# Patient Record
Sex: Female | Born: 1935 | Race: White | Hispanic: No | Smoking: Former smoker
Health system: Southern US, Community
[De-identification: ages and names within clinical notes are randomized; demographics above are authoritative.]

## PROBLEM LIST (undated history)

## (undated) DIAGNOSIS — R42 Dizziness and giddiness: Secondary | ICD-10-CM

## (undated) DIAGNOSIS — L719 Rosacea, unspecified: Secondary | ICD-10-CM

## (undated) DIAGNOSIS — R54 Age-related physical debility: Secondary | ICD-10-CM

## (undated) DIAGNOSIS — K219 Gastro-esophageal reflux disease without esophagitis: Secondary | ICD-10-CM

## (undated) DIAGNOSIS — R0600 Dyspnea, unspecified: Secondary | ICD-10-CM

## (undated) DIAGNOSIS — R413 Other amnesia: Secondary | ICD-10-CM

## (undated) DIAGNOSIS — L409 Psoriasis, unspecified: Secondary | ICD-10-CM

## (undated) DIAGNOSIS — M1990 Unspecified osteoarthritis, unspecified site: Secondary | ICD-10-CM

## (undated) DIAGNOSIS — E039 Hypothyroidism, unspecified: Secondary | ICD-10-CM

## (undated) DIAGNOSIS — I10 Essential (primary) hypertension: Secondary | ICD-10-CM

## (undated) DIAGNOSIS — H9190 Unspecified hearing loss, unspecified ear: Secondary | ICD-10-CM

## (undated) DIAGNOSIS — IMO0001 Reserved for inherently not codable concepts without codable children: Secondary | ICD-10-CM

## (undated) DIAGNOSIS — Z515 Encounter for palliative care: Secondary | ICD-10-CM

## (undated) DIAGNOSIS — E119 Type 2 diabetes mellitus without complications: Secondary | ICD-10-CM

## (undated) DIAGNOSIS — J449 Chronic obstructive pulmonary disease, unspecified: Secondary | ICD-10-CM

## (undated) HISTORY — DX: Other amnesia: R41.3

## (undated) HISTORY — PX: BACK SURGERY: SHX140

## (undated) HISTORY — DX: Dyspnea, unspecified: R06.00

## (undated) HISTORY — DX: Encounter for palliative care: Z51.5

## (undated) HISTORY — DX: Age-related physical debility: R54

## (undated) HISTORY — PX: ABDOMINAL HYSTERECTOMY: SHX81

---

## 2004-05-23 ENCOUNTER — Ambulatory Visit: Payer: Self-pay | Admitting: Gastroenterology

## 2004-07-31 ENCOUNTER — Ambulatory Visit: Payer: Self-pay | Admitting: Family Medicine

## 2005-08-25 ENCOUNTER — Ambulatory Visit: Payer: Self-pay | Admitting: Gastroenterology

## 2005-09-25 ENCOUNTER — Ambulatory Visit: Payer: Self-pay | Admitting: Family Medicine

## 2005-12-27 ENCOUNTER — Other Ambulatory Visit: Payer: Self-pay

## 2005-12-27 ENCOUNTER — Emergency Department: Payer: Self-pay | Admitting: Emergency Medicine

## 2006-10-28 ENCOUNTER — Ambulatory Visit: Payer: Self-pay | Admitting: Family Medicine

## 2008-01-19 ENCOUNTER — Ambulatory Visit: Payer: Self-pay | Admitting: Family Medicine

## 2008-05-26 ENCOUNTER — Ambulatory Visit: Payer: Self-pay | Admitting: Family Medicine

## 2008-07-03 ENCOUNTER — Ambulatory Visit: Payer: Self-pay | Admitting: Unknown Physician Specialty

## 2009-07-18 ENCOUNTER — Ambulatory Visit: Payer: Self-pay | Admitting: Specialist

## 2009-08-30 ENCOUNTER — Ambulatory Visit: Payer: Self-pay | Admitting: Specialist

## 2009-09-12 ENCOUNTER — Ambulatory Visit: Payer: Self-pay

## 2010-09-18 ENCOUNTER — Ambulatory Visit: Payer: Self-pay | Admitting: Internal Medicine

## 2011-09-30 ENCOUNTER — Ambulatory Visit: Payer: Self-pay | Admitting: Internal Medicine

## 2014-07-11 ENCOUNTER — Encounter
Admission: RE | Admit: 2014-07-11 | Discharge: 2014-07-11 | Disposition: A | Discharge status: Home / Self Care | Payer: Medicare Other | Source: Ambulatory Visit | Attending: Ophthalmology | Admitting: Ophthalmology

## 2014-07-11 DIAGNOSIS — Z01812 Encounter for preprocedural laboratory examination: Secondary | ICD-10-CM | POA: Insufficient documentation

## 2014-07-11 DIAGNOSIS — Z01810 Encounter for preprocedural cardiovascular examination: Secondary | ICD-10-CM | POA: Diagnosis present

## 2014-07-11 LAB — POTASSIUM: Potassium: 4.5 mmol/L (ref 3.5–5.1)

## 2014-07-18 ENCOUNTER — Ambulatory Visit: Payer: Medicare Other | Admitting: Anesthesiology

## 2014-07-18 ENCOUNTER — Encounter: Payer: Self-pay | Admitting: *Deleted

## 2014-07-18 ENCOUNTER — Ambulatory Visit
Admission: RE | Admit: 2014-07-18 | Discharge: 2014-07-18 | Disposition: A | Discharge status: Home / Self Care | Payer: Medicare Other | Source: Ambulatory Visit | Attending: Ophthalmology | Admitting: Ophthalmology

## 2014-07-18 ENCOUNTER — Encounter
Admission: RE | Disposition: A | Discharge status: Home / Self Care | Payer: Self-pay | Source: Ambulatory Visit | Attending: Ophthalmology

## 2014-07-18 DIAGNOSIS — E780 Pure hypercholesterolemia: Secondary | ICD-10-CM | POA: Diagnosis not present

## 2014-07-18 DIAGNOSIS — Z79899 Other long term (current) drug therapy: Secondary | ICD-10-CM | POA: Insufficient documentation

## 2014-07-18 DIAGNOSIS — K219 Gastro-esophageal reflux disease without esophagitis: Secondary | ICD-10-CM | POA: Diagnosis not present

## 2014-07-18 DIAGNOSIS — H2511 Age-related nuclear cataract, right eye: Secondary | ICD-10-CM | POA: Diagnosis not present

## 2014-07-18 DIAGNOSIS — Z87891 Personal history of nicotine dependence: Secondary | ICD-10-CM | POA: Diagnosis not present

## 2014-07-18 DIAGNOSIS — M069 Rheumatoid arthritis, unspecified: Secondary | ICD-10-CM | POA: Insufficient documentation

## 2014-07-18 DIAGNOSIS — J449 Chronic obstructive pulmonary disease, unspecified: Secondary | ICD-10-CM | POA: Insufficient documentation

## 2014-07-18 DIAGNOSIS — L409 Psoriasis, unspecified: Secondary | ICD-10-CM | POA: Insufficient documentation

## 2014-07-18 DIAGNOSIS — E119 Type 2 diabetes mellitus without complications: Secondary | ICD-10-CM | POA: Insufficient documentation

## 2014-07-18 DIAGNOSIS — I10 Essential (primary) hypertension: Secondary | ICD-10-CM | POA: Diagnosis not present

## 2014-07-18 DIAGNOSIS — H9190 Unspecified hearing loss, unspecified ear: Secondary | ICD-10-CM | POA: Insufficient documentation

## 2014-07-18 HISTORY — DX: Hypothyroidism, unspecified: E03.9

## 2014-07-18 HISTORY — DX: Essential (primary) hypertension: I10

## 2014-07-18 HISTORY — PX: CATARACT EXTRACTION W/PHACO: SHX586

## 2014-07-18 HISTORY — DX: Type 2 diabetes mellitus without complications: E11.9

## 2014-07-18 LAB — GLUCOSE, CAPILLARY: Glucose-Capillary: 99 mg/dL (ref 65–99)

## 2014-07-18 SURGERY — PHACOEMULSIFICATION, CATARACT, WITH IOL INSERTION
Anesthesia: Monitor Anesthesia Care | Site: Eye | Laterality: Right | Wound class: Clean

## 2014-07-18 MED ORDER — CEFUROXIME OPHTHALMIC INJECTION 1 MG/0.1 ML
INJECTION | OPHTHALMIC | Status: DC | PRN
  Administered 2014-07-18: 0.1 mL via INTRACAMERAL

## 2014-07-18 MED ORDER — SODIUM CHLORIDE 0.9 % IV SOLN
INTRAVENOUS | Status: DC
  Administered 2014-07-18 (×2): via INTRAVENOUS

## 2014-07-18 MED ORDER — MIDAZOLAM HCL 2 MG/2ML IJ SOLN
INTRAMUSCULAR | Status: DC | PRN
  Administered 2014-07-18: 1 mg via INTRAVENOUS

## 2014-07-18 MED ORDER — ARMC OPHTHALMIC DILATING GEL
1.0000 "application " | OPHTHALMIC | Status: DC | PRN
  Administered 2014-07-18 (×2): 1 via OPHTHALMIC

## 2014-07-18 MED ORDER — EPINEPHRINE HCL 1 MG/ML IJ SOLN
INTRAOCULAR | Status: DC | PRN
  Administered 2014-07-18: 200 mL

## 2014-07-18 MED ORDER — CARBACHOL 0.01 % IO SOLN
INTRAOCULAR | Status: DC | PRN
  Administered 2014-07-18: 0.5 mL via INTRAOCULAR

## 2014-07-18 MED ORDER — LIDOCAINE HCL (PF) 1 % IJ SOLN
INTRAOCULAR | Status: DC | PRN
  Administered 2014-07-18: .5 mL via OPHTHALMIC

## 2014-07-18 MED ORDER — MOXIFLOXACIN HCL 0.5 % OP SOLN: 1.0000 [drp] | OPHTHALMIC | Status: DC | PRN

## 2014-07-18 MED ORDER — NA CHONDROIT SULF-NA HYALURON 40-17 MG/ML IO SOLN
INTRAOCULAR | Status: AC
  Filled 2014-07-18: qty 1

## 2014-07-18 MED ORDER — EPINEPHRINE HCL 1 MG/ML IJ SOLN
INTRAMUSCULAR | Status: AC
  Filled 2014-07-18: qty 2

## 2014-07-18 MED ORDER — POVIDONE-IODINE 5 % OP SOLN
OPHTHALMIC | Status: DC
Start: 2014-07-18 — End: 2014-07-18
  Filled 2014-07-18: qty 30

## 2014-07-18 MED ORDER — ARMC OPHTHALMIC DILATING GEL
OPHTHALMIC | Status: AC
  Filled 2014-07-18: qty 0.25

## 2014-07-18 MED ORDER — MOXIFLOXACIN HCL 0.5 % OP SOLN
OPHTHALMIC | Status: AC
  Filled 2014-07-18: qty 3

## 2014-07-18 MED ORDER — FENTANYL CITRATE (PF) 100 MCG/2ML IJ SOLN
INTRAMUSCULAR | Status: DC | PRN
  Administered 2014-07-18: 50 ug via INTRAVENOUS

## 2014-07-18 MED ORDER — CEFUROXIME OPHTHALMIC INJECTION 1 MG/0.1 ML
INJECTION | OPHTHALMIC | Status: AC
  Filled 2014-07-18: qty 0.1

## 2014-07-18 MED ORDER — TETRACAINE HCL 0.5 % OP SOLN
OPHTHALMIC | Status: AC
  Filled 2014-07-18: qty 2

## 2014-07-18 MED ORDER — LIDOCAINE HCL (PF) 4 % IJ SOLN
INTRAMUSCULAR | Status: AC
  Filled 2014-07-18: qty 5

## 2014-07-18 MED ORDER — TETRACAINE HCL 0.5 % OP SOLN
1.0000 [drp] | OPHTHALMIC | Status: DC | PRN
  Administered 2014-07-18: 1 [drp] via OPHTHALMIC

## 2014-07-18 MED ORDER — MOXIFLOXACIN HCL 0.5 % OP SOLN
OPHTHALMIC | Status: DC | PRN
  Administered 2014-07-18: 1 [drp] via OPHTHALMIC

## 2014-07-18 MED ORDER — POVIDONE-IODINE 5 % OP SOLN
1.0000 "application " | OPHTHALMIC | Status: DC | PRN
  Administered 2014-07-18: 1 via OPHTHALMIC

## 2014-07-18 SURGICAL SUPPLY — 22 items
CANNULA ANT/CHMB 27GA (MISCELLANEOUS) ×6 IMPLANT
CUP MEDICINE 2OZ PLAST GRAD ST (MISCELLANEOUS) ×3 IMPLANT
GLOVE BIO SURGEON STRL SZ8 (GLOVE) ×3 IMPLANT
GLOVE BIOGEL M 6.5 STRL (GLOVE) ×3 IMPLANT
GLOVE SURG LX 8.0 MICRO (GLOVE) ×2
GLOVE SURG LX STRL 8.0 MICRO (GLOVE) ×1 IMPLANT
GOWN STRL REUS W/ TWL LRG LVL3 (GOWN DISPOSABLE) ×2 IMPLANT
GOWN STRL REUS W/TWL LRG LVL3 (GOWN DISPOSABLE) ×4
LENS IOL TECNIS 20.5 (Intraocular Lens) ×3 IMPLANT
LENS IOL TECNIS MONO 1P 20.5 (Intraocular Lens) ×1 IMPLANT
PACK CATARACT (MISCELLANEOUS) ×3 IMPLANT
PACK CATARACT BRASINGTON LX (MISCELLANEOUS) ×3 IMPLANT
PACK EYE AFTER SURG (MISCELLANEOUS) ×3 IMPLANT
SOL BSS BAG (MISCELLANEOUS) ×3
SOL PREP PVP 2OZ (MISCELLANEOUS) ×3
SOLUTION BSS BAG (MISCELLANEOUS) ×1 IMPLANT
SOLUTION PREP PVP 2OZ (MISCELLANEOUS) ×1 IMPLANT
SYR 3ML LL SCALE MARK (SYRINGE) ×3 IMPLANT
SYR 5ML LL (SYRINGE) ×3 IMPLANT
SYR TB 1ML 27GX1/2 LL (SYRINGE) ×3 IMPLANT
WATER STERILE IRR 1000ML POUR (IV SOLUTION) ×3 IMPLANT
WIPE NON LINTING 3.25X3.25 (MISCELLANEOUS) ×3 IMPLANT

## 2014-07-18 NOTE — Op Note (Signed)
PREOPERATIVE DIAGNOSIS:  Nuclear sclerotic cataract of the right eye.   POSTOPERATIVE DIAGNOSIS: nuclear sclerotic cataract right eye   OPERATIVE PROCEDURE:  Procedure(s): CATARACT EXTRACTION PHACO AND INTRAOCULAR LENS PLACEMENT (IOC)   SURGEON:  Galen Manila, MD.   ANESTHESIA:  Anesthesiologist: Berdine Addison, MD CRNA: Junious Silk, CRNA  1.      Managed anesthesia care. 2.      Topical tetracaine drops followed by 2% Xylocaine jelly applied in the preoperative holding area.       3.   0.2 ml of epi-Shugarcaine was  placed in the anterior chamber following the paracentesis.    COMPLICATIONS:  None.   TECHNIQUE:   Stop and chop   DESCRIPTION OF PROCEDURE:  The patient was examined and consented in the preoperative holding area where the aforementioned topical anesthesia was applied to the right eye and then brought back to the Operating Room where the right eye was prepped and draped in the usual sterile ophthalmic fashion and a lid speculum was placed. A paracentesis was created with the side port blade and the anterior chamber was filled with viscoelastic. A near clear corneal incision was performed with the steel keratome. A continuous curvilinear capsulorrhexis was performed with a cystotome followed by the capsulorrhexis forceps. Hydrodissection and hydrodelineation were carried out with BSS on a blunt cannula. The lens was removed in a stop and chop  technique and the remaining cortical material was removed with the irrigation-aspiration handpiece. The capsular bag was inflated with viscoelastic and the Technis ZCB00  lens was placed in the capsular bag without complication. The remaining viscoelastic was removed from the eye with the irrigation-aspiration handpiece. The wounds were hydrated. The anterior chamber was flushed with Miostat and the eye was inflated to physiologic pressure. 0.1 mL of cefuroxime concentration 10 mg/mL was placed in the anterior chamber. The wounds were  found to be water tight. The eye was dressed with Vigamox. The patient was given protective glasses to wear throughout the day and a shield with which to sleep tonight. The patient was also given drops with which to begin a drop regimen today and will follow-up with me in one day.  Implant Name Type Inv. Item Serial No. Manufacturer Lot No. LRB No. Used  LENS IMPL INTRAOC ZCB00 20.5 - [id number] Intraocular Lens LENS IMPL INTRAOC ZCB00 20.5 [id number]   Right 1   Procedure(s) with comments: CATARACT EXTRACTION PHACO AND INTRAOCULAR LENS PLACEMENT (IOC) (Right) - [place] 01:18 AP% 26.9 CDE 21.20 fluid pack lot #[id number]  Electronically signed: Ellarose Brandi L[place]IS 07/18/2014 8:15 AM

## 2014-07-18 NOTE — Anesthesia Preprocedure Evaluation (Signed)
Anesthesia Evaluation  Patient identified by MRN, date of birth, ID band Patient awake    Reviewed: Allergy & Precautions, NPO status , Patient's Chart, lab work & pertinent test results, reviewed documented beta blocker date and time   Airway Mallampati: II  TM Distance: >3 FB     Dental  (+) Chipped   Pulmonary former smoker,          Cardiovascular hypertension, Pt. on medications     Neuro/Psych    GI/Hepatic   Endo/Other  diabetesHypothyroidism   Renal/GU      Musculoskeletal   Abdominal   Peds  Hematology   Anesthesia Other Findings   Reproductive/Obstetrics                             Anesthesia Physical Anesthesia Plan  ASA: III  Anesthesia Plan: General and MAC   Post-op Pain Management:    Induction:   Airway Management Planned:   Additional Equipment:   Intra-op Plan:   Post-operative Plan:   Informed Consent: I have reviewed the patients History and Physical, chart, labs and discussed the procedure including the risks, benefits and alternatives for the proposed anesthesia with the patient or authorized representative who has indicated his/her understanding and acceptance.     Plan Discussed with: CRNA  Anesthesia Plan Comments:         Anesthesia Quick Evaluation

## 2014-07-18 NOTE — Discharge Instructions (Signed)
AMBULATORY SURGERY  °DISCHARGE INSTRUCTIONS ° ° °1) The drugs that you were given will stay in your system until tomorrow so for the next 24 hours you should not: ° °A) Drive an automobile °B) Make any legal decisions °C) Drink any alcoholic beverage ° ° °2) You may resume regular meals tomorrow.  Today it is better to start with liquids and gradually work up to solid foods. ° °You may eat anything you prefer, but it is better to start with liquids, then soup and crackers, and gradually work up to solid foods. ° ° °3) Please notify your doctor immediately if you have any unusual bleeding, trouble breathing, redness and pain at the surgery site, drainage, fever, or pain not relieved by medication. ° ° ° °4) Additional Instructions: ° ° ° °Cataract Surgery °Care After °Refer to this sheet in the next few weeks. These instructions provide you with information on caring for yourself after your procedure. Your caregiver may also give you more specific instructions. Your treatment has been planned according to current medical practices, but problems sometimes occur. Call your caregiver if you have any problems or questions after your procedure.  °HOME CARE INSTRUCTIONS  °· Avoid strenuous activities as directed by your caregiver. °· Ask your caregiver when you can resume driving. °· Use eyedrops or other medicines to help healing and control pressure inside your eye as directed by your caregiver. °· Only take over-the-counter or prescription medicines for pain, discomfort, or fever as directed by your caregiver. °· Do not to touch or rub your eyes. °· You may be instructed to use a protective shield during the first few days and nights after surgery. If not, wear sunglasses to protect your eyes. This is to protect the eye from pressure or from being accidentally bumped. °· Keep the area around your eye clean and dry. Avoid swimming or allowing water to hit you directly in the face while showering. Keep soap and shampoo  out of your eyes. °· Do not bend or lift heavy objects. Bending increases pressure in the eye. You can walk, climb stairs, and do light household chores. °· Do not put a contact lens into the eye that had surgery until your caregiver says it is okay to do so. °· Ask your doctor when you can return to work. This will depend on the kind of work that you do. If you work in a dusty environment, you may be advised to wear protective eyewear for a period of time. °· Ask your caregiver when it will be safe to engage in sexual activity. °· Continue with your regular eye exams as directed by your caregiver. °What to expect: °· It is normal to feel itching and mild discomfort for a few days after cataract surgery. Some fluid discharge is also common, and your eye may be sensitive to light and touch. °· After 1 to 2 days, even moderate discomfort should disappear. In most cases, healing will take about 6 weeks. °· If you received an intraocular lens (IOL), you may notice that colors are very bright or have a blue tinge. Also, if you have been in bright sunlight, everything may appear reddish for a few hours. If you see these color tinges, it is because your lens is clear and no longer cloudy. Within a few months after receiving an IOL, these extra colors should go away. When you have healed, you will probably need new glasses. °SEEK MEDICAL CARE IF:  °· You have increased bruising around your   eye. °· You have discomfort not helped by medicine. °SEEK IMMEDIATE MEDICAL CARE IF:  °· You have a  fever. °· You have a worsening or sudden vision loss. °· You have redness, swelling, or increasing pain in the eye. °· You have a thick discharge from the eye that had surgery. °MAKE SURE YOU: °· Understand these instructions. °· Will watch your condition. °· Will get help right away if you are not doing well or get worse. °Document Released: 07/12/2004 Document Revised: 03/17/2011 Document Reviewed: 08/16/2010 °ExitCare® Patient  Information ©2015 [employer]. This information is not intended to replace advice given to you by your health care provider. Make sure you discuss any questions you have with your health care provider. ° ° ° ° °Please contact your physician with any problems or Same Day Surgery at [phone], Monday through Friday 6 am to 4 pm, or [place] [facility] [place] Main number at [phone]. °

## 2014-07-18 NOTE — H&P (Signed)
  All labs reviewed. Abnormal studies sent to patients PCP when indicated.  Previous H&P reviewed, patient examined, there are NO CHANGES.  Amanda Mercado LOUIS7/12/20167:44 AM

## 2014-07-18 NOTE — Anesthesia Procedure Notes (Signed)
Procedure Name: MAC Date/Time: 07/18/2014 8:00 AM Performed by: Junious Silk Pre-anesthesia Checklist: Patient identified, Emergency Drugs available, Suction available, Patient being monitored and Timeout performed

## 2014-07-18 NOTE — Anesthesia Postprocedure Evaluation (Signed)
  Anesthesia Post-op Note  Patient: Amanda Mercado  Procedure(s) Performed: Procedure(s) with comments: CATARACT EXTRACTION PHACO AND INTRAOCULAR LENS PLACEMENT (IOC) (Right) - [place] 01:18 AP% 26.9 CDE 21.20 fluid pack lot #[id number]  Anesthesia type:General, MAC  Patient location: PACU  Post pain: Pain level controlled  Post assessment: Post-op Vital signs reviewed, Patient's Cardiovascular Status Stable, Respiratory Function Stable, Patent Airway and No signs of Nausea or vomiting  Post vital signs: Reviewed and stable  Last Vitals:  Filed Vitals:   07/18/14 0702  BP: 100/88  Pulse: 76  Temp: 36.9 C  Resp: 18    Level of consciousness: awake, alert  and patient cooperative  Complications: No apparent anesthesia complications

## 2014-07-18 NOTE — Transfer of Care (Signed)
Immediate Anesthesia Transfer of Care Note  Patient: Amanda Mercado  Procedure(s) Performed: Procedure(s) with comments: CATARACT EXTRACTION PHACO AND INTRAOCULAR LENS PLACEMENT (IOC) (Right) - [place] 01:18 AP% 26.9 CDE 21.20 fluid pack lot #[id number]  Patient Location: PACU  Anesthesia Type:MAC  Level of Consciousness: awake, alert  and oriented  Airway & Oxygen Therapy: Patient Spontanous Breathing  Post-op Assessment: Report given to RN  Post vital signs: Reviewed and stable  Last Vitals:  Filed Vitals:   07/18/14 0702  BP: 100/88  Pulse: 76  Temp: 36.9 C  Resp: 18    Complications: No apparent anesthesia complications

## 2014-07-25 ENCOUNTER — Other Ambulatory Visit: Payer: Medicare Other

## 2014-07-27 ENCOUNTER — Encounter: Payer: Self-pay | Admitting: *Deleted

## 2014-07-27 NOTE — OR Nursing (Signed)
Seated in chair for first eye and would like if available

## 2014-07-28 ENCOUNTER — Inpatient Hospital Stay: Admission: RE | Admit: 2014-07-28 | Payer: Medicare Other | Source: Ambulatory Visit

## 2014-08-01 ENCOUNTER — Encounter: Payer: Self-pay | Admitting: *Deleted

## 2014-08-01 ENCOUNTER — Encounter
Admission: RE | Disposition: A | Discharge status: Home / Self Care | Payer: Self-pay | Source: Ambulatory Visit | Attending: Ophthalmology

## 2014-08-01 ENCOUNTER — Ambulatory Visit: Payer: Medicare Other | Admitting: Certified Registered Nurse Anesthetist

## 2014-08-01 ENCOUNTER — Ambulatory Visit
Admission: RE | Admit: 2014-08-01 | Discharge: 2014-08-01 | Disposition: A | Discharge status: Home / Self Care | Payer: Medicare Other | Source: Ambulatory Visit | Attending: Ophthalmology | Admitting: Ophthalmology

## 2014-08-01 DIAGNOSIS — Z87891 Personal history of nicotine dependence: Secondary | ICD-10-CM | POA: Diagnosis not present

## 2014-08-01 DIAGNOSIS — J449 Chronic obstructive pulmonary disease, unspecified: Secondary | ICD-10-CM | POA: Diagnosis not present

## 2014-08-01 DIAGNOSIS — H2512 Age-related nuclear cataract, left eye: Secondary | ICD-10-CM | POA: Diagnosis not present

## 2014-08-01 DIAGNOSIS — E119 Type 2 diabetes mellitus without complications: Secondary | ICD-10-CM | POA: Insufficient documentation

## 2014-08-01 DIAGNOSIS — Z79899 Other long term (current) drug therapy: Secondary | ICD-10-CM | POA: Insufficient documentation

## 2014-08-01 HISTORY — DX: Psoriasis, unspecified: L40.9

## 2014-08-01 HISTORY — DX: Chronic obstructive pulmonary disease, unspecified: J44.9

## 2014-08-01 HISTORY — DX: Rosacea, unspecified: L71.9

## 2014-08-01 HISTORY — PX: ANTERIOR VITRECTOMY: SHX1173

## 2014-08-01 HISTORY — DX: Dizziness and giddiness: R42

## 2014-08-01 HISTORY — DX: Gastro-esophageal reflux disease without esophagitis: K21.9

## 2014-08-01 HISTORY — DX: Reserved for inherently not codable concepts without codable children: IMO0001

## 2014-08-01 HISTORY — PX: CATARACT EXTRACTION W/PHACO: SHX586

## 2014-08-01 HISTORY — DX: Unspecified osteoarthritis, unspecified site: M19.90

## 2014-08-01 HISTORY — DX: Unspecified hearing loss, unspecified ear: H91.90

## 2014-08-01 LAB — GLUCOSE, CAPILLARY: Glucose-Capillary: 116 mg/dL — ABNORMAL HIGH (ref 65–99)

## 2014-08-01 SURGERY — PHACOEMULSIFICATION, CATARACT, WITH IOL INSERTION
Anesthesia: Monitor Anesthesia Care | Laterality: Left

## 2014-08-01 MED ORDER — EPINEPHRINE HCL 1 MG/ML IJ SOLN
INTRAMUSCULAR | Status: AC
  Filled 2014-08-01: qty 1

## 2014-08-01 MED ORDER — LIDOCAINE HCL (PF) 4 % IJ SOLN
INTRAMUSCULAR | Status: AC
  Filled 2014-08-01: qty 5

## 2014-08-01 MED ORDER — BRIMONIDINE TARTRATE 0.2 % OP SOLN
OPHTHALMIC | Status: AC
  Filled 2014-08-01: qty 5

## 2014-08-01 MED ORDER — POVIDONE-IODINE 5 % OP SOLN
OPHTHALMIC | Status: AC
  Administered 2014-08-01: 1 via OPHTHALMIC
  Filled 2014-08-01: qty 30

## 2014-08-01 MED ORDER — EPINEPHRINE HCL 1 MG/ML IJ SOLN
INTRAOCULAR | Status: DC | PRN
  Administered 2014-08-01: 250 mL

## 2014-08-01 MED ORDER — DEXMEDETOMIDINE HCL 200 MCG/2ML IV SOLN
INTRAVENOUS | Status: DC | PRN
Start: 2014-08-01 — End: 2014-08-01
  Administered 2014-08-01: 8 ug via INTRAVENOUS

## 2014-08-01 MED ORDER — SODIUM CHLORIDE 0.9 % IV SOLN
INTRAVENOUS | Status: DC
  Administered 2014-08-01: 07:00:00 via INTRAVENOUS

## 2014-08-01 MED ORDER — MOXIFLOXACIN HCL 0.5 % OP SOLN
OPHTHALMIC | Status: DC | PRN
  Administered 2014-08-01: 2 [drp] via OPHTHALMIC

## 2014-08-01 MED ORDER — ARMC OPHTHALMIC DILATING GEL
1.0000 "application " | OPHTHALMIC | Status: DC | PRN
  Administered 2014-08-01: 1 via OPHTHALMIC

## 2014-08-01 MED ORDER — MOXIFLOXACIN HCL 0.5 % OP SOLN
OPHTHALMIC | Status: AC
  Filled 2014-08-01: qty 3

## 2014-08-01 MED ORDER — FENTANYL CITRATE (PF) 100 MCG/2ML IJ SOLN
INTRAMUSCULAR | Status: DC | PRN
  Administered 2014-08-01: 50 ug via INTRAVENOUS

## 2014-08-01 MED ORDER — MOXIFLOXACIN HCL 0.5 % OP SOLN: 1.0000 [drp] | Freq: Once | OPHTHALMIC | Status: DC

## 2014-08-01 MED ORDER — BRIMONIDINE TARTRATE 0.2 % OP SOLN
OPHTHALMIC | Status: DC | PRN
  Administered 2014-08-01: 2 [drp] via OPHTHALMIC

## 2014-08-01 MED ORDER — CEFUROXIME OPHTHALMIC INJECTION 1 MG/0.1 ML
INJECTION | OPHTHALMIC | Status: DC | PRN
Start: 2014-08-01 — End: 2014-08-01
  Administered 2014-08-01: 0.1 mL via INTRACAMERAL

## 2014-08-01 MED ORDER — CEFUROXIME OPHTHALMIC INJECTION 1 MG/0.1 ML
INJECTION | OPHTHALMIC | Status: AC
  Filled 2014-08-01: qty 0.1

## 2014-08-01 MED ORDER — NA CHONDROIT SULF-NA HYALURON 40-17 MG/ML IO SOLN
INTRAOCULAR | Status: AC
  Filled 2014-08-01: qty 1

## 2014-08-01 MED ORDER — CARBACHOL 0.01 % IO SOLN
INTRAOCULAR | Status: DC | PRN
  Administered 2014-08-01: 0.5 mL via INTRAOCULAR

## 2014-08-01 MED ORDER — TETRACAINE HCL 0.5 % OP SOLN
OPHTHALMIC | Status: AC
  Administered 2014-08-01: 1 [drp] via OPHTHALMIC
  Filled 2014-08-01: qty 2

## 2014-08-01 MED ORDER — LIDOCAINE HCL (PF) 4 % IJ SOLN
INTRAMUSCULAR | Status: DC | PRN
  Administered 2014-08-01: 1 mL

## 2014-08-01 MED ORDER — POVIDONE-IODINE 5 % OP SOLN
1.0000 "application " | OPHTHALMIC | Status: AC | PRN
  Administered 2014-08-01: 1 via OPHTHALMIC

## 2014-08-01 MED ORDER — TETRACAINE HCL 0.5 % OP SOLN
1.0000 [drp] | OPHTHALMIC | Status: AC | PRN
  Administered 2014-08-01: 1 [drp] via OPHTHALMIC

## 2014-08-01 MED ORDER — ARMC OPHTHALMIC DILATING GEL
OPHTHALMIC | Status: AC
  Administered 2014-08-01: 1 via OPHTHALMIC
  Filled 2014-08-01: qty 0.25

## 2014-08-01 SURGICAL SUPPLY — 28 items
ANTERIOR CHAMBER CANNULA ×2 IMPLANT
BSS 15ML ×3 IMPLANT
CANNULA ANT/CHMB 27GA (MISCELLANEOUS) ×3 IMPLANT
CUP MEDICINE 2OZ PLAST GRAD ST (MISCELLANEOUS) ×3 IMPLANT
GLOVE BIO SURGEON STRL SZ8 (GLOVE) ×3 IMPLANT
GLOVE BIOGEL M 6.5 STRL (GLOVE) ×3 IMPLANT
GLOVE SURG LX 8.0 MICRO (GLOVE) ×2
GLOVE SURG LX STRL 8.0 MICRO (GLOVE) ×1 IMPLANT
GOWN STRL REUS W/ TWL LRG LVL3 (GOWN DISPOSABLE) ×2 IMPLANT
GOWN STRL REUS W/TWL LRG LVL3 (GOWN DISPOSABLE) ×4
LENS IOL ACRSF EXPAND 0.0 (Intraocular Lens) ×1 IMPLANT
LENS IOL ACRYSOF EXPAND 0.0 (Intraocular Lens) ×3 IMPLANT
LENS IOL TECNIS 21 (Intraocular Lens) IMPLANT
LENS IOL TECNIS 21.0 (Intraocular Lens) IMPLANT
LENS IOL TECNIS MONO 1P 21.0 (Intraocular Lens) IMPLANT
PACK CATARACT (MISCELLANEOUS) ×3 IMPLANT
PACK CATARACT BRASINGTON LX (MISCELLANEOUS) ×3 IMPLANT
PACK EYE AFTER SURG (MISCELLANEOUS) ×3 IMPLANT
PLUG VITRECTOMY PROBE 23G (MISCELLANEOUS) ×3 IMPLANT
SOL BSS BAG (MISCELLANEOUS) ×3
SOL PREP PVP 2OZ (MISCELLANEOUS) ×3
SOLUTION BSS BAG (MISCELLANEOUS) ×1 IMPLANT
SOLUTION PREP PVP 2OZ (MISCELLANEOUS) ×1 IMPLANT
SUT ETHILON 10 0 CS140 6 (SUTURE) ×3 IMPLANT
SYR 5ML LL (SYRINGE) ×3 IMPLANT
SYR TB 1ML 27GX1/2 LL (SYRINGE) ×3 IMPLANT
WATER STERILE IRR 1000ML POUR (IV SOLUTION) ×3 IMPLANT
WIPE NON LINTING 3.25X3.25 (MISCELLANEOUS) ×3 IMPLANT

## 2014-08-01 NOTE — Op Note (Addendum)
PREOPERATIVE DIAGNOSIS:  Nuclear sclerotic cataract of the left eye.   POSTOPERATIVE DIAGNOSIS:  NUCLEAR SCLEROTIC CATARACT LEFT EYE    OPERATIVE PROCEDURE:  Procedure(s): CATARACT EXTRACTION PHACO AND INTRAOCULAR LENS PLACEMENT (IOC) ANTERIOR VITRECTOMY   SURGEON:  Galen Manila, MD.   ANESTHESIA:   Anesthesiologist: Yevette Edwards, MD CRNA: Darrol Jump, CRNA  1.      Managed anesthesia care. 2.      Topical tetracaine drops followed by 2% Xylocaine jelly applied in the preoperative holding area.       3.      0.2 ml of epi-Shugarcaine was  placed in the anterior chamber following the paracentesis.    COMPLICATIONS:  see  narrative below   TECHNIQUE:   Stop and chop   DESCRIPTION OF PROCEDURE:  The patient was examined and consented in the preoperative holding area where the aforementioned topical anesthesia was applied to the left eye and then brought back to the Operating Room where the left eye was prepped and draped in the usual sterile ophthalmic fashion and a lid speculum was placed. A paracentesis was created with the side port blade and the anterior chamber was filled with viscoelastic. A near clear corneal incision was performed with the steel keratome. A continuous curvilinear capsulorrhexis was performed with a cystotome followed by the capsulorrhexis forceps. Hydrodissection and hydrodelineation were carried out with BSS on a blunt cannula. The lens was removed in a stop and chop  technique.  As the last piece of the nucleus was removed a large rent in the posterior capsule developed.  Discovisc was used to tamponade the vitreous and an Alcon MA60 AC 21.0 diopter lens was placed in the sulcus. A 10-0 nylon suture was placed in the main incision and a second para was created to facilitate anterior vitrectomy. Vitreous was removed from the [facility]. Miostat was place in the eye followed by cefuroxime. The eye was inflated to a physiologic pressure. The eye was dressed with vigamox and  Alphagan. The course of surgery was discussed with the patient and her family.. The patient was given protective glasses to wear throughout the day and a shield with which to sleep tonight. The patient was also given drops with which to begin a drop regimen today and will follow-up with me later today.  Implant Name Type Inv. Item Serial No. Manufacturer Lot No. LRB No. Used  LENS IMPL INTRAOC ZCB00 21.0 - [id number] Intraocular Lens LENS IMPL INTRAOC ZCB00 21.0 [id number]  Left 1  LENS IOL - [id number] Intraocular Lens LENS IOL [id number] ALCON   Left 1   Procedure(s) with comments: CATARACT EXTRACTION PHACO AND INTRAOCULAR LENS PLACEMENT (IOC) (Left) - cassette lot # [id number] H   [place] 1:35.6AP  25.3 CDE 24.7 ANTERIOR VITRECTOMY (Left)  Electronically signed: Kiyani Jernigan L[place]IS 08/01/2014 8:11 AM

## 2014-08-01 NOTE — Anesthesia Preprocedure Evaluation (Addendum)
Anesthesia Evaluation  Patient identified by MRN, date of birth, ID band Patient awake    Reviewed: Allergy & Precautions, H&P , NPO status , Patient's Chart, lab work & pertinent test results, reviewed documented beta blocker date and time   Airway Mallampati: II  TM Distance: >3 FB Neck ROM: full    Dental no notable dental hx. (+) Teeth Intact   Pulmonary neg pulmonary ROS, shortness of breath, COPDformer smoker,  breath sounds clear to auscultation  Pulmonary exam normal       Cardiovascular Exercise Tolerance: Poor hypertension, negative cardio ROS  Rhythm:regular Rate:Normal  Upon arrival pt with some mild nausea and queaziness that resolved spontaneously.  No anginal equivalents or other concerning sx.  Recent miralax secondary to constipation.  Vss. O/w looks good.  Will proceed.   Neuro/Psych negative neurological ROS  negative psych ROS   GI/Hepatic negative GI ROS, Neg liver ROS,   Endo/Other  negative endocrine ROSdiabetes  Renal/GU negative Renal ROS  negative genitourinary   Musculoskeletal   Abdominal   Peds  Hematology negative hematology ROS (+)   Anesthesia Other Findings   Reproductive/Obstetrics negative OB ROS                            Anesthesia Physical Anesthesia Plan  ASA: III  Anesthesia Plan: MAC   Post-op Pain Management:    Induction:   Airway Management Planned:   Additional Equipment:   Intra-op Plan:   Post-operative Plan:   Informed Consent: I have reviewed the patients History and Physical, chart, labs and discussed the procedure including the risks, benefits and alternatives for the proposed anesthesia with the patient or authorized representative who has indicated his/her understanding and acceptance.   Dental Advisory Given  Plan Discussed with: CRNA  Anesthesia Plan Comments:         Anesthesia Quick Evaluation

## 2014-08-01 NOTE — H&P (Signed)
  All labs reviewed. Abnormal studies sent to patients PCP when indicated.  Previous H&P reviewed, patient examined, there are NO CHANGES.  Amanda Mercado LOUIS7/26/20167:23 AM

## 2014-08-01 NOTE — Discharge Instructions (Signed)
AMBULATORY SURGERY  °DISCHARGE INSTRUCTIONS ° ° °1) The drugs that you were given will stay in your system until tomorrow so for the next 24 hours you should not: ° °A) Drive an automobile °B) Make any legal decisions °C) Drink any alcoholic beverage ° ° °2) You may resume regular meals tomorrow.  Today it is better to start with liquids and gradually work up to solid foods. ° °You may eat anything you prefer, but it is better to start with liquids, then soup and crackers, and gradually work up to solid foods. ° ° °3) Please notify your doctor immediately if you have any unusual bleeding, trouble breathing, redness and pain at the surgery site, drainage, fever, or pain not relieved by medication. ° ° ° °4) Additional Instructions: ° ° ° °Cataract Surgery °Care After °Refer to this sheet in the next few weeks. These instructions provide you with information on caring for yourself after your procedure. Your caregiver may also give you more specific instructions. Your treatment has been planned according to current medical practices, but problems sometimes occur. Call your caregiver if you have any problems or questions after your procedure.  °HOME CARE INSTRUCTIONS  °· Avoid strenuous activities as directed by your caregiver. °· Ask your caregiver when you can resume driving. °· Use eyedrops or other medicines to help healing and control pressure inside your eye as directed by your caregiver. °· Only take over-the-counter or prescription medicines for pain, discomfort, or fever as directed by your caregiver. °· Do not to touch or rub your eyes. °· You may be instructed to use a protective shield during the first few days and nights after surgery. If not, wear sunglasses to protect your eyes. This is to protect the eye from pressure or from being accidentally bumped. °· Keep the area around your eye clean and dry. Avoid swimming or allowing water to hit you directly in the face while showering. Keep soap and shampoo  out of your eyes. °· Do not bend or lift heavy objects. Bending increases pressure in the eye. You can walk, climb stairs, and do light household chores. °· Do not put a contact lens into the eye that had surgery until your caregiver says it is okay to do so. °· Ask your doctor when you can return to work. This will depend on the kind of work that you do. If you work in a dusty environment, you may be advised to wear protective eyewear for a period of time. °· Ask your caregiver when it will be safe to engage in sexual activity. °· Continue with your regular eye exams as directed by your caregiver. °What to expect: °· It is normal to feel itching and mild discomfort for a few days after cataract surgery. Some fluid discharge is also common, and your eye may be sensitive to light and touch. °· After 1 to 2 days, even moderate discomfort should disappear. In most cases, healing will take about 6 weeks. °· If you received an intraocular lens (IOL), you may notice that colors are very bright or have a blue tinge. Also, if you have been in bright sunlight, everything may appear reddish for a few hours. If you see these color tinges, it is because your lens is clear and no longer cloudy. Within a few months after receiving an IOL, these extra colors should go away. When you have healed, you will probably need new glasses. °SEEK MEDICAL CARE IF:  °· You have increased bruising around your   eye. °· You have discomfort not helped by medicine. °SEEK IMMEDIATE MEDICAL CARE IF:  °· You have a  fever. °· You have a worsening or sudden vision loss. °· You have redness, swelling, or increasing pain in the eye. °· You have a thick discharge from the eye that had surgery. °MAKE SURE YOU: °· Understand these instructions. °· Will watch your condition. °· Will get help right away if you are not doing well or get worse. °Document Released: 07/12/2004 Document Revised: 03/17/2011 Document Reviewed: 08/16/2010 °Exit[employer]® Patient  Information ©2015 [employer]. This information is not intended to replace advice given to you by your health care provider. Make sure you discuss any questions you have with your health care provider. ° ° ° ° °Please contact your physician with any problems or Same Day Surgery at [phone], Monday through Friday 6 am to 4 pm, or [place] at [place] Main number at [phone]. °

## 2014-08-01 NOTE — Transfer of Care (Signed)
Immediate Anesthesia Transfer of Care Note  Patient: Amanda Mercado  Procedure(s) Performed: Procedure(s) with comments: CATARACT EXTRACTION PHACO AND INTRAOCULAR LENS PLACEMENT (IOC) (Left) - cassette lot # [id number] H   [place] 1:35.6AP  25.3 CDE 24.7 ANTERIOR VITRECTOMY (Left)  Patient Location: PACU  Anesthesia Type:MAC  Level of Consciousness: awake, alert , oriented and patient cooperative  Airway & Oxygen Therapy: Patient Spontanous Breathing  Post-op Assessment: Report given to RN and Post -op Vital signs reviewed and stable  Post vital signs: Reviewed and stable  Last Vitals:  Filed Vitals:   08/01/14 0814  BP: 145/84  Pulse:   Temp: 36.6 C  Resp: 16    Complications: No apparent anesthesia complications

## 2014-08-01 NOTE — Anesthesia Postprocedure Evaluation (Signed)
  Anesthesia Post-op Note  Patient: Amanda Mercado  Procedure(s) Performed: Procedure(s) with comments: CATARACT EXTRACTION PHACO AND INTRAOCULAR LENS PLACEMENT (IOC) (Left) - cassette lot # [id number] H   [place] 1:35.6AP  25.3 CDE 24.7 ANTERIOR VITRECTOMY (Left)  Anesthesia type:MAC  Patient location: PACU  Post pain: Pain level controlled  Post assessment: Post-op Vital signs reviewed, Patient's Cardiovascular Status Stable, Respiratory Function Stable, Patent Airway and No signs of Nausea or vomiting  Post vital signs: Reviewed and stable  Last Vitals:  Filed Vitals:   08/01/14 0814  BP: 145/84  Pulse:   Temp: 36.6 C  Resp: 16    Level of consciousness: awake, alert  and patient cooperative  Complications: No apparent anesthesia complications

## 2015-03-22 ENCOUNTER — Other Ambulatory Visit: Payer: Self-pay | Admitting: Internal Medicine

## 2015-03-22 DIAGNOSIS — R413 Other amnesia: Secondary | ICD-10-CM

## 2015-03-30 ENCOUNTER — Ambulatory Visit: Payer: Medicare Other

## 2015-12-17 ENCOUNTER — Emergency Department: Payer: Medicare Other

## 2015-12-17 ENCOUNTER — Emergency Department
Admission: EM | Admit: 2015-12-17 | Discharge: 2015-12-18 | Disposition: A | Discharge status: Home / Self Care | Payer: Medicare Other | Attending: Emergency Medicine | Admitting: Emergency Medicine

## 2015-12-17 ENCOUNTER — Encounter: Payer: Self-pay | Admitting: Emergency Medicine

## 2015-12-17 DIAGNOSIS — J449 Chronic obstructive pulmonary disease, unspecified: Secondary | ICD-10-CM | POA: Diagnosis not present

## 2015-12-17 DIAGNOSIS — W1839XA Other fall on same level, initial encounter: Secondary | ICD-10-CM | POA: Diagnosis not present

## 2015-12-17 DIAGNOSIS — E039 Hypothyroidism, unspecified: Secondary | ICD-10-CM | POA: Diagnosis not present

## 2015-12-17 DIAGNOSIS — W19XXXA Unspecified fall, initial encounter: Secondary | ICD-10-CM

## 2015-12-17 DIAGNOSIS — S0990XA Unspecified injury of head, initial encounter: Secondary | ICD-10-CM | POA: Diagnosis present

## 2015-12-17 DIAGNOSIS — S0101XA Laceration without foreign body of scalp, initial encounter: Secondary | ICD-10-CM | POA: Insufficient documentation

## 2015-12-17 DIAGNOSIS — Y939 Activity, unspecified: Secondary | ICD-10-CM | POA: Diagnosis not present

## 2015-12-17 DIAGNOSIS — Z87891 Personal history of nicotine dependence: Secondary | ICD-10-CM | POA: Diagnosis not present

## 2015-12-17 DIAGNOSIS — Z79899 Other long term (current) drug therapy: Secondary | ICD-10-CM | POA: Insufficient documentation

## 2015-12-17 DIAGNOSIS — E119 Type 2 diabetes mellitus without complications: Secondary | ICD-10-CM | POA: Insufficient documentation

## 2015-12-17 DIAGNOSIS — I10 Essential (primary) hypertension: Secondary | ICD-10-CM | POA: Diagnosis not present

## 2015-12-17 DIAGNOSIS — Y92002 Bathroom of unspecified non-institutional (private) residence single-family (private) house as the place of occurrence of the external cause: Secondary | ICD-10-CM | POA: Diagnosis not present

## 2015-12-17 DIAGNOSIS — Y999 Unspecified external cause status: Secondary | ICD-10-CM | POA: Diagnosis not present

## 2015-12-17 LAB — BASIC METABOLIC PANEL
ANION GAP: 7 (ref 5–15)
BUN: 29 mg/dL — ABNORMAL HIGH (ref 6–20)
CALCIUM: 9.8 mg/dL (ref 8.9–10.3)
CHLORIDE: 102 mmol/L (ref 101–111)
CO2: 27 mmol/L (ref 22–32)
Creatinine, Ser: 1.1 mg/dL — ABNORMAL HIGH (ref 0.44–1.00)
GFR calc non Af Amer: 42 mL/min — ABNORMAL LOW (ref >60)
GFR, EST AFRICAN AMERICAN: 49 mL/min — AB (ref >60)
GLUCOSE: 131 mg/dL — AB (ref 65–99)
POTASSIUM: 3.8 mmol/L (ref 3.5–5.1)
Sodium: 136 mmol/L (ref 135–145)

## 2015-12-17 LAB — CBC
HEMATOCRIT: 38.3 % (ref 35.0–47.0)
HEMOGLOBIN: 12.8 g/dL (ref 12.0–16.0)
MCH: 30.3 pg (ref 26.0–34.0)
MCHC: 33.5 g/dL (ref 32.0–36.0)
MCV: 90.5 fL (ref 80.0–100.0)
Platelets: 236 10*3/uL (ref 150–440)
RBC: 4.23 MIL/uL (ref 3.80–5.20)
RDW: 13.2 % (ref 11.5–14.5)
WBC: 8.6 10*3/uL (ref 3.6–11.0)

## 2015-12-17 LAB — TROPONIN I: Troponin I: 0.05 ng/mL (ref <0.03)

## 2015-12-17 NOTE — ED Provider Notes (Signed)
[facility] Emergency Department Provider Note  Time seen: 11:21 PM  I have reviewed the triage vital signs and the nursing notes.   HISTORY  Chief Complaint Fall    HPI Amanda Mercado is a 82 y.o. female presents to the emergency department after a fall. According to family and the patient she lives alone, normally cares for herself. Patient reports tonight she fell in the bathroom, does not recall the circumstances of the fall. She is able to crawl to the kitchen to call her family. Family states they arrived and found significant blood in the bathroom, blood throughout the house in the kitchen. Patient does not recall falling or why she fell. Family reports over the past 2-3 weeks the patient has been a little bit more confused than her normal. They state the patient has not fallen for approximately 18 months. Patient has what appears to be an occipital scalp laceration on arrival. Very significant amount of dried blood over head and body.  Past Medical History:  Diagnosis Date  . Arthritis    ra  . COPD (chronic obstructive pulmonary disease) (HCC)   . Diabetes mellitus without complication (HCC)   . Dizziness    occ  . GERD (gastroesophageal reflux disease)   . HOH (hard of hearing)   . Hypertension   . Hypothyroidism   . Psoriasis   . Rosacea   . Shortness of breath dyspnea     There are no active problems to display for this patient.   Past Surgical History:  Procedure Laterality Date  . ABDOMINAL HYSTERECTOMY    . ANTERIOR VITRECTOMY Left 08/01/2014   Procedure: ANTERIOR VITRECTOMY;  Surgeon: Galen Manila, MD;  Location: [facility] ORS;  Service: Ophthalmology;  Laterality: Left;  . BACK SURGERY    . CATARACT EXTRACTION W/PHACO Right 07/18/2014   Procedure: CATARACT EXTRACTION PHACO AND INTRAOCULAR LENS PLACEMENT (IOC);  Surgeon: Galen Manila, MD;  Location: [facility] ORS;  Service: Ophthalmology;  Laterality: Right;  [place] 01:18 AP% 26.9 CDE  21.20 fluid pack lot #[id number]  . CATARACT EXTRACTION W/PHACO Left 08/01/2014   Procedure: CATARACT EXTRACTION PHACO AND INTRAOCULAR LENS PLACEMENT (IOC);  Surgeon: Galen Manila, MD;  Location: [facility] ORS;  Service: Ophthalmology;  Laterality: Left;  cassette lot # [id number]   [place] 1:35.6AP  25.3 CDE 24.7    Prior to Admission medications   Medication Sig Start Date End Date Taking? Authorizing Provider  cholecalciferol (VITAMIN D) 1000 UNITS tablet Take 1,000 Units by mouth daily.    Historical Provider, MD  ezetimibe (ZETIA) 10 MG tablet Take 10 mg by mouth daily.    Historical Provider, MD  levothyroxine (SYNTHROID, LEVOTHROID) 75 MCG tablet Take 75 mcg by mouth daily before breakfast.    Historical Provider, MD  losartan-hydrochlorothiazide (HYZAAR) 100-12.5 MG per tablet Take 1 tablet by mouth daily.    Historical Provider, MD  neomycin-polymyxin-dexameth (MAXITROL) 0.1 % OINT 1 application 2 (two) times daily as needed (to eyelids).    Historical Provider, MD  polyethylene glycol (MIRALAX / GLYCOLAX) packet Take 17 g by mouth daily as needed.    Historical Provider, MD    Allergies  Allergen Reactions  . Statins Other (See Comments)    Entire body aches, bones and muscles    History reviewed. No pertinent family history.  Social History Social History  Substance Use Topics  . Smoking status: Former Games developer  . Smokeless tobacco: Never Used  . Alcohol use No    Review of Systems Constitutional:  Negative for fever. Cardiovascular: Negative for chest pain. Respiratory: Negative for shortness of breath. Gastrointestinal: Negative for abdominal pain Genitourinary: Negative for dysuria. Musculoskeletal: Negative for back pain. Skin: Occipital scalp laceration Neurological: Mild headache. Denies focal weakness or numbness. 10-point ROS otherwise negative.  ____________________________________________   PHYSICAL EXAM:  Constitutional: Alert and oriented. Well appearing and  in no distress. Eyes: Normal exam ENT   Head: Moderate hematoma with apparent laceration to occipital scalp, significant amount of dried blood making it difficult to evaluate laceration. No apparent active bleeding.   Mouth/Throat: Mucous membranes are moist. Cardiovascular: Normal rate, regular rhythm. No murmur Respiratory: Normal respiratory effort without tachypnea nor retractions. Breath sounds are clear  Gastrointestinal: Soft and nontender. No distention.   Musculoskeletal: Nontender with normal range of motion in all extremities. Good range of motion in all extremities. Nontender hips. Neurologic:  Normal speech and language. No gross focal neurologic deficits  Skin:  Skin is warm, dry and intact.  Psychiatric: Mood and affect are normal.   ____________________________________________    EKG  EKG reviewed and interpreted by myself shows normal sinus rhythm at 75 bpm, narrow QRS, left axis deviation, normal intervals but no concerning ST changes.  ____________________________________________    RADIOLOGY  CT scans are negative  ____________________________________________   INITIAL IMPRESSION / ASSESSMENT AND PLAN / ED COURSE  Pertinent labs & imaging results that were available during my care of the patient were reviewed by me and considered in my medical decision making (see chart for details).  Patient presents after a fall. We will obtain CT scans of the head and neck to further evaluate. Patient has good range of motion in all extremities, besides the occipital scalp laceration and hematoma no other apparent signs of trauma on examination.  CT scans are negative. Labs are largely within normal limits. Urinalysis normal. Patient's laceration repaired with 3 staples. Remains hemostatic. Patient will be discharged home. Family will be staying with the patient.   LACERATION REPAIR Performed by: Minna Antis Authorized by: Minna Antis Consent:  Verbal consent obtained. Risks and benefits: risks, benefits and alternatives were discussed Consent given by: patient Patient identity confirmed: provided demographic data Prepped and Draped in normal sterile fashion Wound explored  Laceration Location: occipital scalp  Laceration Length: 3cm  No Foreign Bodies seen or palpated  Anesthesia: local infiltration  Local anesthetic: lidocaine 1% w/o epinephrine  Anesthetic total: 5 ml  Irrigation method: syringe Amount of cleaning: standard  Skin closure: staples  Number of staples: 3   Patient tolerance: Patient tolerated the procedure well with no immediate complications.   ____________________________________________   FINAL CLINICAL IMPRESSION(S) / ED DIAGNOSES  Fall Scalp laceration Head injury    Minna Antis, MD 12/18/15 [phone]

## 2015-12-17 NOTE — ED Triage Notes (Signed)
Pt arrived by EMS from home post unwitnessed fall. EMS reports family came home to pt post fall in bathroom floor, unwitnessed. Upon arrival to ED pt is A&O x3, hx of dementia, hematoma to posterior head, pt c/o pain to area of hematoma.

## 2015-12-18 LAB — URINALYSIS, ROUTINE W REFLEX MICROSCOPIC
BACTERIA UA: NONE SEEN
Bilirubin Urine: NEGATIVE
GLUCOSE, UA: NEGATIVE mg/dL
Ketones, ur: 5 mg/dL — AB
LEUKOCYTES UA: NEGATIVE
NITRITE: NEGATIVE
PH: 6 (ref 5.0–8.0)
Protein, ur: NEGATIVE mg/dL
SPECIFIC GRAVITY, URINE: 1.017 (ref 1.005–1.030)
SQUAMOUS EPITHELIAL / LPF: NONE SEEN

## 2015-12-18 MED ORDER — LIDOCAINE HCL (PF) 1 % IJ SOLN
5.0000 mL | Freq: Once | INTRAMUSCULAR | Status: AC
  Administered 2015-12-18: 5 mL via INTRADERMAL

## 2015-12-18 MED ORDER — LIDOCAINE HCL (PF) 1 % IJ SOLN
INTRAMUSCULAR | Status: AC
  Administered 2015-12-18: 5 mL via INTRADERMAL
  Filled 2015-12-18: qty 5

## 2015-12-18 NOTE — Discharge Instructions (Signed)
Please follow-up with your primary care doctor in 7-10 days for staple removal. Please return to the emergency department for any further falls, headache, focal weakness or numbness. Please use Tylenol every 6 hours as written on the box for any discomfort.

## 2017-02-24 ENCOUNTER — Encounter: Payer: Self-pay | Admitting: Emergency Medicine

## 2017-02-24 ENCOUNTER — Emergency Department: Payer: Medicare Other

## 2017-02-24 ENCOUNTER — Emergency Department
Admission: EM | Admit: 2017-02-24 | Discharge: 2017-02-24 | Disposition: A | Discharge status: Skilled Nursing Facility | Payer: Medicare Other | Attending: Student in an Organized Health Care Education/Training Program | Admitting: Student in an Organized Health Care Education/Training Program

## 2017-02-24 ENCOUNTER — Other Ambulatory Visit: Payer: Self-pay

## 2017-02-24 DIAGNOSIS — E119 Type 2 diabetes mellitus without complications: Secondary | ICD-10-CM | POA: Diagnosis not present

## 2017-02-24 DIAGNOSIS — Z87891 Personal history of nicotine dependence: Secondary | ICD-10-CM | POA: Insufficient documentation

## 2017-02-24 DIAGNOSIS — J449 Chronic obstructive pulmonary disease, unspecified: Secondary | ICD-10-CM | POA: Diagnosis not present

## 2017-02-24 DIAGNOSIS — F0390 Unspecified dementia without behavioral disturbance: Secondary | ICD-10-CM | POA: Insufficient documentation

## 2017-02-24 DIAGNOSIS — Z79899 Other long term (current) drug therapy: Secondary | ICD-10-CM | POA: Insufficient documentation

## 2017-02-24 DIAGNOSIS — M25552 Pain in left hip: Secondary | ICD-10-CM | POA: Insufficient documentation

## 2017-02-24 DIAGNOSIS — E039 Hypothyroidism, unspecified: Secondary | ICD-10-CM | POA: Insufficient documentation

## 2017-02-24 DIAGNOSIS — I10 Essential (primary) hypertension: Secondary | ICD-10-CM | POA: Insufficient documentation

## 2017-02-24 DIAGNOSIS — W19XXXA Unspecified fall, initial encounter: Secondary | ICD-10-CM

## 2017-02-24 LAB — COMPREHENSIVE METABOLIC PANEL
ALK PHOS: 58 U/L (ref 38–126)
ALT: 16 U/L (ref 14–54)
AST: 22 U/L (ref 15–41)
Albumin: 3.7 g/dL (ref 3.5–5.0)
Anion gap: 10 (ref 5–15)
BILIRUBIN TOTAL: 1 mg/dL (ref 0.3–1.2)
BUN: 24 mg/dL — AB (ref 6–20)
CALCIUM: 9.3 mg/dL (ref 8.9–10.3)
CO2: 25 mmol/L (ref 22–32)
CREATININE: 1.07 mg/dL — AB (ref 0.44–1.00)
Chloride: 102 mmol/L (ref 101–111)
GFR calc Af Amer: 50 mL/min — ABNORMAL LOW (ref >60)
GFR calc non Af Amer: 43 mL/min — ABNORMAL LOW (ref >60)
Glucose, Bld: 106 mg/dL — ABNORMAL HIGH (ref 65–99)
POTASSIUM: 4 mmol/L (ref 3.5–5.1)
Sodium: 137 mmol/L (ref 135–145)
TOTAL PROTEIN: 6.8 g/dL (ref 6.5–8.1)

## 2017-02-24 LAB — CBC WITH DIFFERENTIAL/PLATELET
BASOS ABS: 0 10*3/uL (ref 0–0.1)
BASOS PCT: 1 %
Eosinophils Absolute: 0.4 10*3/uL (ref 0–0.7)
Eosinophils Relative: 6 %
HEMATOCRIT: 34.8 % — AB (ref 35.0–47.0)
HEMOGLOBIN: 11.5 g/dL — AB (ref 12.0–16.0)
LYMPHS PCT: 29 %
Lymphs Abs: 1.9 10*3/uL (ref 1.0–3.6)
MCH: 31.1 pg (ref 26.0–34.0)
MCHC: 33 g/dL (ref 32.0–36.0)
MCV: 94.2 fL (ref 80.0–100.0)
Monocytes Absolute: 0.6 10*3/uL (ref 0.2–0.9)
Monocytes Relative: 10 %
NEUTROS ABS: 3.7 10*3/uL (ref 1.4–6.5)
NEUTROS PCT: 54 %
Platelets: 255 10*3/uL (ref 150–440)
RBC: 3.69 MIL/uL — AB (ref 3.80–5.20)
RDW: 12.8 % (ref 11.5–14.5)
WBC: 6.7 10*3/uL (ref 3.6–11.0)

## 2017-02-24 NOTE — ED Notes (Signed)
This RN and Social worker ambulated pt to the toilet. Pt unable to provide urine but was able to have BM. Dr. Roxan Hockey aware.

## 2017-02-24 NOTE — Discharge Instructions (Signed)
Follow-up with PCP.  Return for any additional questions or concerns.  Follow-up with orthopedics regarding hip pain.

## 2017-02-24 NOTE — ED Notes (Signed)
Called [facility] for transport to [facility]

## 2017-02-24 NOTE — ED Notes (Signed)
Attempted to in and out cath pt for urine. Pt tense and resistant to cath. Unable to obtain urine sample. Dr. Roxan Hockey aware.

## 2017-02-24 NOTE — ED Notes (Signed)
Pure wick catheter placed on patient in attempted to obtain urine

## 2017-02-24 NOTE — ED Notes (Signed)
Pt given sandwich tray 

## 2017-02-24 NOTE — ED Notes (Signed)
Pt placed on bed pan to see if she is able to urinate.

## 2017-02-24 NOTE — ED Notes (Signed)
Report called to cottage at [facility]

## 2017-02-24 NOTE — ED Notes (Signed)
Call to EMS for [facility]. Son signed discharge information.

## 2017-02-24 NOTE — ED Notes (Signed)
Pt returned from CT °

## 2017-02-24 NOTE — ED Provider Notes (Signed)
[facility] Emergency Department Provider Note    None    (approximate)  I have reviewed the triage vital signs and the nursing notes.   HISTORY  Chief Complaint Fall  Level V caveat: dementia   HPI Amanda Mercado is a 84 y.o. female resents from nursing facility for evaluation of falls that started this morning.  Last fall was unwitnessed.  There is no report of LOC.  Patient unable to ambulate and is complaining of mild to moderate left hip pain and cramping sensation.  No history of previous surgeries to the hip.  Denies any numbness or tingling.  No shortness of breath but does have documented history of COPD and does have wheeze on exam.  Past Medical History:  Diagnosis Date  . Arthritis    ra  . COPD (chronic obstructive pulmonary disease) (HCC)   . Diabetes mellitus without complication (HCC)   . Dizziness    occ  . GERD (gastroesophageal reflux disease)   . HOH (hard of hearing)   . Hypertension   . Hypothyroidism   . Psoriasis   . Rosacea   . Shortness of breath dyspnea    No family history on file. Past Surgical History:  Procedure Laterality Date  . ABDOMINAL HYSTERECTOMY    . ANTERIOR VITRECTOMY Left 08/01/2014   Procedure: ANTERIOR VITRECTOMY;  Surgeon: Galen Manila, MD;  Location: [facility] ORS;  Service: Ophthalmology;  Laterality: Left;  . BACK SURGERY    . CATARACT EXTRACTION W/PHACO Right 07/18/2014   Procedure: CATARACT EXTRACTION PHACO AND INTRAOCULAR LENS PLACEMENT (IOC);  Surgeon: Galen Manila, MD;  Location: [facility] ORS;  Service: Ophthalmology;  Laterality: Right;  [place] 01:18 AP% 26.9 CDE 21.20 fluid pack lot #[id number]  . CATARACT EXTRACTION W/PHACO Left 08/01/2014   Procedure: CATARACT EXTRACTION PHACO AND INTRAOCULAR LENS PLACEMENT (IOC);  Surgeon: Galen Manila, MD;  Location: [facility] ORS;  Service: Ophthalmology;  Laterality: Left;  cassette lot # [id number]   [place] 1:35.6AP  25.3 CDE 24.7   There are no active problems  to display for this patient.     Prior to Admission medications   Medication Sig Start Date End Date Taking? Authorizing Provider  acetaminophen (TYLENOL) 500 MG tablet Take 500 mg by mouth every 6 (six) hours as needed.   Yes [provider]  brimonidine-timolol (COMBIGAN) 0.2-0.5 % ophthalmic solution Place 1 drop into both eyes every 12 (twelve) hours.   Yes [provider]  Cholecalciferol (VITAMIN D) 2000 units CAPS Take 4,000 Units by mouth daily.    Yes [provider]  ezetimibe (ZETIA) 10 MG tablet Take 10 mg by mouth daily.   Yes [provider]  Ginkgo 60 MG TABS Take 1 tablet by mouth daily.   Yes [provider]  hydrocortisone (ANUSOL-HC) 25 MG suppository Place 25 mg rectally 2 (two) times daily.   Yes [provider]  levothyroxine (SYNTHROID, LEVOTHROID) 75 MCG tablet Take 75 mcg by mouth daily before breakfast.   Yes [provider]  losartan-hydrochlorothiazide (HYZAAR) 100-12.5 MG per tablet Take 1 tablet by mouth daily.   Yes [provider]  meclizine (ANTIVERT) 25 MG tablet Take 25 mg by mouth 3 (three) times daily as needed for dizziness.   Yes [provider]  sennosides-docusate sodium (SENOKOT-S) 8.6-50 MG tablet Take 1 tablet by mouth daily.   Yes [provider]  traMADol (ULTRAM) 50 MG tablet Take 50 mg by mouth every 6 (six) hours as needed.  Yes [provider]  vitamin B-12 (CYANOCOBALAMIN) 1000 MCG tablet Take 1,000 mcg by mouth daily.   Yes [provider]  polyethylene glycol (MIRALAX / GLYCOLAX) packet Take 17 g by mouth daily as needed.    [provider]    Allergies Statins    Social History Social History   Tobacco Use  . Smoking status: Former Games developer  . Smokeless tobacco: Never Used  Substance Use Topics  . Alcohol use: No  . Drug use: Not on file    Review of Systems Patient denies headaches, rhinorrhea, blurry vision,  numbness, shortness of breath, chest pain, edema, cough, abdominal pain, nausea, vomiting, diarrhea, dysuria, fevers, rashes or hallucinations unless otherwise stated above in HPI. ____________________________________________   PHYSICAL EXAM:  VITAL SIGNS: Vitals:   02/24/17 1330 02/24/17 1400  BP: (!) 154/74 (!) 182/161  Pulse: 72 88  Resp: 18 (!) 30  Temp:    SpO2: 96% 95%    Constitutional: Alert and oriented. Chronically ill appearing but in no acute distress. Eyes: Conjunctivae are normal.  Head: Atraumatic. Nose: No congestion/rhinnorhea. Mouth/Throat: Mucous membranes are moist.   Neck: No stridor. Painless ROM.  Cardiovascular: Normal rate, regular rhythm. Grossly normal heart sounds.  Good peripheral circulation. Respiratory: Normal respiratory effort.  No retractions. Lungs CTAB. Gastrointestinal: Soft and nontender. No distention. No abdominal bruits. No CVA tenderness. Genitourinary: normal external genitalia Musculoskeletal: ttp of left hip and pain with log roll, left leg shortened  No joint effusions. Neurologic:  Normal speech and language. No gross focal neurologic deficits are appreciated. No facial droop Skin:  Skin is warm, dry and intact. No rash noted. Psychiatric: Mood and affect are normal. Speech and behavior are normal.  ____________________________________________   LABS (all labs ordered are listed, but only abnormal results are displayed)  Results for orders placed or performed during the hospital encounter of 02/24/17 (from the past 24 hour(s))  CBC with Differential/Platelet     Status: Abnormal   Collection Time: 02/24/17 10:16 AM  Result Value Ref Range   WBC 6.7 3.6 - 11.0 K/uL   RBC 3.69 (L) 3.80 - 5.20 MIL/uL   Hemoglobin 11.5 (L) 12.0 - 16.0 g/dL   HCT 20.8 (L) 02.2 - 33.6 %   MCV 94.2 80.0 - 100.0 fL   MCH 31.1 26.0 - 34.0 pg   MCHC 33.0 32.0 - 36.0 g/dL   RDW 12.2 44.9 - 75.3 %   Platelets 255 150 - 440 K/uL   Neutrophils  Relative % 54 %   Neutro Abs 3.7 1.4 - 6.5 K/uL   Lymphocytes Relative 29 %   Lymphs Abs 1.9 1.0 - 3.6 K/uL   Monocytes Relative 10 %   Monocytes Absolute 0.6 0.2 - 0.9 K/uL   Eosinophils Relative 6 %   Eosinophils Absolute 0.4 0 - 0.7 K/uL   Basophils Relative 1 %   Basophils Absolute 0.0 0 - 0.1 K/uL  Comprehensive metabolic panel     Status: Abnormal   Collection Time: 02/24/17 10:16 AM  Result Value Ref Range   Sodium 137 135 - 145 mmol/L   Potassium 4.0 3.5 - 5.1 mmol/L   Chloride 102 101 - 111 mmol/L   CO2 25 22 - 32 mmol/L   Glucose, Bld 106 (H) 65 - 99 mg/dL   BUN 24 (H) 6 - 20 mg/dL   Creatinine, Ser 0.05 (H) 0.44 - 1.00 mg/dL   Calcium 9.3 8.9 - 11.0 mg/dL   Total Protein 6.8 6.5 -  8.1 g/dL   Albumin 3.7 3.5 - 5.0 g/dL   AST 22 15 - 41 U/L   ALT 16 14 - 54 U/L   Alkaline Phosphatase 58 38 - 126 U/L   Total Bilirubin 1.0 0.3 - 1.2 mg/dL   GFR calc non Af Amer 43 (L) >60 mL/min   GFR calc Af Amer 50 (L) >60 mL/min   Anion gap 10 5 - 15   ____________________________________________  EKG My review and personal interpretation at Time: 10:04   Indication: fall  Rate: 70  Rhythm: sinus Axis: normal Other: no dysrhythmia, normal intervals ____________________________________________  RADIOLOGY  I personally reviewed all radiographic images ordered to evaluate for the above acute complaints and reviewed radiology reports and findings.  These findings were personally discussed with the patient.  Please see medical record for radiology report.  ____________________________________________   PROCEDURES  Procedure(s) performed:  Procedures    Critical Care performed: no ____________________________________________   INITIAL IMPRESSION / ASSESSMENT AND PLAN / ED COURSE  Pertinent labs & imaging results that were available during my care of the patient were reviewed by me and considered in my medical decision making (see chart for details).  DDX: fracutre,  dislocation, sdh, iph, uti, dehydration  ZIARA THELANDER is a 84 y.o. who presents to the ED with symptoms as described above.  She is well-appearing and in no acute distress.  Does have some wheeze but seems to chronically have this according to family.  They state that she is at her baseline.  No report of fevers.  She is in no acute distress.  Blood work and radiographs will be sent for the above differential.  Clinical Course as of Feb 25 1516  Tue Feb 24, 2017  1115 Hip x-ray shows no definite fracture but patient does have pain on exam therefore will order CT imaging to further evaluate.  [PR]  1229 CT shows no evidence of fracture.  There is evidence of significant degenerative changes.  Still awaiting urinalysis.  Family at bedside states that she rarely is able to ambulate due to chronic pain in left hip.  [PR]  1453 Family requesting discharge back to facility at this point.  Patient still has not provided urine but she is not febrile.  Unlikely UTI.  Workup is otherwise reassuring.  Will provide referral to [facility] given extensive degenerative arthritis.  Have discussed with the patient and available family all diagnostics and treatments performed thus far and all questions were answered to the best of my ability. The patient demonstrates understanding and agreement with plan.   [PR]    Clinical Course User Index [PR] Willy Eddy, MD     ____________________________________________   FINAL CLINICAL IMPRESSION(S) / ED DIAGNOSES  Final diagnoses:  Fall, initial encounter  Acute hip pain, left      NEW MEDICATIONS STARTED DURING THIS VISIT:  New Prescriptions   No medications on file     Note:  This document was prepared using Dragon voice recognition software and may include unintentional dictation errors.    Willy Eddy, MD 02/24/17 [phone]

## 2017-02-24 NOTE — ED Triage Notes (Signed)
Pt to ED via [facility] from [facility] for falls. Per EMS staff reports that pt has fallen multiple times this morning. Last fall was unwitnessed fall. Pt A & O x 3, in NAD at this time

## 2017-02-24 NOTE — ED Notes (Signed)
Pt being transported to CT by CT tech

## 2017-06-25 ENCOUNTER — Emergency Department: Payer: Medicare Other

## 2017-06-25 ENCOUNTER — Emergency Department
Admission: EM | Admit: 2017-06-25 | Discharge: 2017-06-26 | Disposition: A | Discharge status: Home / Self Care | Payer: Medicare Other | Source: Home / Self Care | Attending: Emergency Medicine | Admitting: Emergency Medicine

## 2017-06-25 ENCOUNTER — Other Ambulatory Visit: Payer: Self-pay

## 2017-06-25 DIAGNOSIS — Z7401 Bed confinement status: Secondary | ICD-10-CM | POA: Diagnosis not present

## 2017-06-25 DIAGNOSIS — L409 Psoriasis, unspecified: Secondary | ICD-10-CM | POA: Diagnosis not present

## 2017-06-25 DIAGNOSIS — Z79899 Other long term (current) drug therapy: Secondary | ICD-10-CM | POA: Insufficient documentation

## 2017-06-25 DIAGNOSIS — Z961 Presence of intraocular lens: Secondary | ICD-10-CM | POA: Diagnosis not present

## 2017-06-25 DIAGNOSIS — Z9842 Cataract extraction status, left eye: Secondary | ICD-10-CM | POA: Diagnosis not present

## 2017-06-25 DIAGNOSIS — I10 Essential (primary) hypertension: Secondary | ICD-10-CM

## 2017-06-25 DIAGNOSIS — Z87891 Personal history of nicotine dependence: Secondary | ICD-10-CM

## 2017-06-25 DIAGNOSIS — Z888 Allergy status to other drugs, medicaments and biological substances status: Secondary | ICD-10-CM | POA: Diagnosis not present

## 2017-06-25 DIAGNOSIS — R0602 Shortness of breath: Secondary | ICD-10-CM | POA: Insufficient documentation

## 2017-06-25 DIAGNOSIS — E876 Hypokalemia: Secondary | ICD-10-CM | POA: Insufficient documentation

## 2017-06-25 DIAGNOSIS — T502X5A Adverse effect of carbonic-anhydrase inhibitors, benzothiadiazides and other diuretics, initial encounter: Secondary | ICD-10-CM | POA: Diagnosis not present

## 2017-06-25 DIAGNOSIS — E119 Type 2 diabetes mellitus without complications: Secondary | ICD-10-CM | POA: Insufficient documentation

## 2017-06-25 DIAGNOSIS — R4182 Altered mental status, unspecified: Secondary | ICD-10-CM | POA: Diagnosis not present

## 2017-06-25 DIAGNOSIS — Z79890 Hormone replacement therapy (postmenopausal): Secondary | ICD-10-CM | POA: Diagnosis not present

## 2017-06-25 DIAGNOSIS — N179 Acute kidney failure, unspecified: Secondary | ICD-10-CM | POA: Diagnosis not present

## 2017-06-25 DIAGNOSIS — E860 Dehydration: Secondary | ICD-10-CM

## 2017-06-25 DIAGNOSIS — Z79891 Long term (current) use of opiate analgesic: Secondary | ICD-10-CM | POA: Diagnosis not present

## 2017-06-25 DIAGNOSIS — K219 Gastro-esophageal reflux disease without esophagitis: Secondary | ICD-10-CM | POA: Diagnosis not present

## 2017-06-25 DIAGNOSIS — J449 Chronic obstructive pulmonary disease, unspecified: Secondary | ICD-10-CM

## 2017-06-25 DIAGNOSIS — E039 Hypothyroidism, unspecified: Secondary | ICD-10-CM

## 2017-06-25 DIAGNOSIS — F0390 Unspecified dementia without behavioral disturbance: Secondary | ICD-10-CM | POA: Diagnosis not present

## 2017-06-25 DIAGNOSIS — H9190 Unspecified hearing loss, unspecified ear: Secondary | ICD-10-CM | POA: Diagnosis not present

## 2017-06-25 DIAGNOSIS — Z90710 Acquired absence of both cervix and uterus: Secondary | ICD-10-CM | POA: Diagnosis not present

## 2017-06-25 DIAGNOSIS — Z66 Do not resuscitate: Secondary | ICD-10-CM | POA: Diagnosis not present

## 2017-06-25 DIAGNOSIS — Z9841 Cataract extraction status, right eye: Secondary | ICD-10-CM | POA: Diagnosis not present

## 2017-06-25 LAB — TROPONIN I: Troponin I: <0.03 ng/mL (ref <0.03)

## 2017-06-25 LAB — URINALYSIS, COMPLETE (UACMP) WITH MICROSCOPIC
BACTERIA UA: NONE SEEN
BILIRUBIN URINE: NEGATIVE
Glucose, UA: NEGATIVE mg/dL
HGB URINE DIPSTICK: NEGATIVE
KETONES UR: NEGATIVE mg/dL
LEUKOCYTES UA: NEGATIVE
NITRITE: NEGATIVE
PH: 6 (ref 5.0–8.0)
Protein, ur: NEGATIVE mg/dL
Specific Gravity, Urine: 1.008 (ref 1.005–1.030)
Squamous Epithelial / LPF: NONE SEEN (ref 0–5)

## 2017-06-25 LAB — COMPREHENSIVE METABOLIC PANEL
ALBUMIN: 4.1 g/dL (ref 3.5–5.0)
ALT: 13 U/L — ABNORMAL LOW (ref 14–54)
AST: 23 U/L (ref 15–41)
Alkaline Phosphatase: 69 U/L (ref 38–126)
Anion gap: 16 — ABNORMAL HIGH (ref 5–15)
BILIRUBIN TOTAL: 0.7 mg/dL (ref 0.3–1.2)
BUN: 97 mg/dL — AB (ref 6–20)
CALCIUM: 9.6 mg/dL (ref 8.9–10.3)
CO2: 28 mmol/L (ref 22–32)
Chloride: 89 mmol/L — ABNORMAL LOW (ref 101–111)
Creatinine, Ser: 2.46 mg/dL — ABNORMAL HIGH (ref 0.44–1.00)
GFR calc Af Amer: 18 mL/min — ABNORMAL LOW (ref >60)
GFR calc non Af Amer: 16 mL/min — ABNORMAL LOW (ref >60)
GLUCOSE: 113 mg/dL — AB (ref 65–99)
Potassium: 3 mmol/L — ABNORMAL LOW (ref 3.5–5.1)
Sodium: 133 mmol/L — ABNORMAL LOW (ref 135–145)
TOTAL PROTEIN: 7.7 g/dL (ref 6.5–8.1)

## 2017-06-25 LAB — CBC WITH DIFFERENTIAL/PLATELET
BASOS ABS: 0 10*3/uL (ref 0–0.1)
BASOS PCT: 1 %
EOS PCT: 5 %
Eosinophils Absolute: 0.4 10*3/uL (ref 0–0.7)
HCT: 34.7 % — ABNORMAL LOW (ref 35.0–47.0)
Hemoglobin: 11.6 g/dL — ABNORMAL LOW (ref 12.0–16.0)
Lymphocytes Relative: 29 %
Lymphs Abs: 2.4 10*3/uL (ref 1.0–3.6)
MCH: 29 pg (ref 26.0–34.0)
MCHC: 33.3 g/dL (ref 32.0–36.0)
MCV: 87 fL (ref 80.0–100.0)
MONO ABS: 0.7 10*3/uL (ref 0.2–0.9)
Monocytes Relative: 8 %
Neutro Abs: 4.8 10*3/uL (ref 1.4–6.5)
Neutrophils Relative %: 57 %
Platelets: 263 10*3/uL (ref 150–440)
RBC: 3.99 MIL/uL (ref 3.80–5.20)
RDW: 14.7 % — AB (ref 11.5–14.5)
WBC: 8.3 10*3/uL (ref 3.6–11.0)

## 2017-06-25 LAB — BRAIN NATRIURETIC PEPTIDE: B NATRIURETIC PEPTIDE 5: 64 pg/mL (ref 0.0–100.0)

## 2017-06-25 MED ORDER — DEXTROSE 5 % AND 0.45 % NACL IV BOLUS
500.0000 mL | Freq: Once | INTRAVENOUS | Status: DC
  Filled 2017-06-25: qty 500

## 2017-06-25 MED ORDER — POTASSIUM CHLORIDE 10 MEQ/100ML IV SOLN
10.0000 meq | Freq: Once | INTRAVENOUS | Status: DC
  Filled 2017-06-25: qty 100

## 2017-06-25 NOTE — ED Provider Notes (Signed)
[facility] Emergency Department Provider Note ____________________________________________   First MD Initiated Contact with Patient 06/25/17 2159     (approximate)  I have reviewed the triage vital signs and the nursing notes.   HISTORY  Chief Complaint Respiratory Distress  Level 5 caveat: History of present illness limited due to dementia  HPI Amanda Mercado is a 84 y.o. female with PMH as noted below who presents with increased generalized weakness over the last several days, as well as an episode of shortness of breath with wheezing and cyanosis to her fingers and toes earlier today.  She received a DuoNeb at her facility and improved.   Past Medical History:  Diagnosis Date  . Arthritis    ra  . COPD (chronic obstructive pulmonary disease) (HCC)   . Diabetes mellitus without complication (HCC)   . Dizziness    occ  . GERD (gastroesophageal reflux disease)   . HOH (hard of hearing)   . Hypertension   . Hypothyroidism   . Psoriasis   . Rosacea   . Shortness of breath dyspnea     There are no active problems to display for this patient.   Past Surgical History:  Procedure Laterality Date  . ABDOMINAL HYSTERECTOMY    . ANTERIOR VITRECTOMY Left 08/01/2014   Procedure: ANTERIOR VITRECTOMY;  Surgeon: Galen Manila, MD;  Location: [facility] ORS;  Service: Ophthalmology;  Laterality: Left;  . BACK SURGERY    . CATARACT EXTRACTION W/PHACO Right 07/18/2014   Procedure: CATARACT EXTRACTION PHACO AND INTRAOCULAR LENS PLACEMENT (IOC);  Surgeon: Galen Manila, MD;  Location: [facility] ORS;  Service: Ophthalmology;  Laterality: Right;  [place] 01:18 AP% 26.9 CDE 21.20 fluid pack lot #[id number]  . CATARACT EXTRACTION W/PHACO Left 08/01/2014   Procedure: CATARACT EXTRACTION PHACO AND INTRAOCULAR LENS PLACEMENT (IOC);  Surgeon: Galen Manila, MD;  Location: [facility] ORS;  Service: Ophthalmology;  Laterality: Left;  cassette lot # [id number]   [place] 1:35.6AP  25.3 CDE  24.7    Prior to Admission medications   Medication Sig Start Date End Date Taking? Authorizing Provider  brimonidine-timolol (COMBIGAN) 0.2-0.5 % ophthalmic solution Place 1 drop into both eyes every 12 (twelve) hours.   Yes [provider]  ezetimibe (ZETIA) 10 MG tablet Take 10 mg by mouth daily.   Yes [provider]  Ginkgo 60 MG TABS Take 1 tablet by mouth daily.   Yes [provider]  levothyroxine (SYNTHROID, LEVOTHROID) 75 MCG tablet Take 75 mcg by mouth daily before breakfast.   Yes [provider]  loratadine (CLARITIN) 10 MG tablet Take 10 mg by mouth daily.   Yes [provider]  losartan-hydrochlorothiazide (HYZAAR) 100-12.5 MG per tablet Take 1 tablet by mouth daily.   Yes [provider]  mirtazapine (REMERON) 7.5 MG tablet Take 1 tablet by mouth at bedtime. 06/15/17  Yes [provider]  acetaminophen (TYLENOL) 500 MG tablet Take 500 mg by mouth every 6 (six) hours as needed.    [provider]  Cholecalciferol (VITAMIN D) 2000 units CAPS Take 4,000 Units by mouth daily.     [provider]  hydrocortisone (ANUSOL-HC) 25 MG suppository Place 25 mg rectally 2 (two) times daily.    [provider]  meclizine (ANTIVERT) 25 MG tablet Take 25 mg by mouth 3 (three) times daily as needed for dizziness.    [provider]  polyethylene glycol (MIRALAX / GLYCOLAX) packet Take 17 g by mouth daily as needed.    [provider]  sennosides-docusate sodium (SENOKOT-S) 8.6-50 MG tablet Take 1 tablet by mouth daily.    [provider]  traMADol (ULTRAM) 50 MG tablet Take 50 mg by mouth every 6 (six) hours as needed.    [provider]  vitamin B-12 (CYANOCOBALAMIN) 1000 MCG tablet Take 1,000 mcg by mouth daily.    [provider]    Allergies Statins  History reviewed. No pertinent family history.  Social History Social History   Tobacco Use  . Smoking  status: Former Games developer  . Smokeless tobacco: Never Used  Substance Use Topics  . Alcohol use: No  . Drug use: Not on file    Review of Systems Level 5 caveat: Unable to obtain review of systems due to dementia    ____________________________________________   PHYSICAL EXAM:  VITAL SIGNS: ED Triage Vitals  Enc Vitals Group     BP 06/25/17 2121 118/63     Pulse Rate 06/25/17 2121 (!) 58     Resp 06/25/17 2121 14     Temp 06/25/17 2121 98 F (36.7 C)     Temp Source 06/25/17 2121 Oral     SpO2 06/25/17 2121 100 %     Weight 06/25/17 2122 170 lb (77.1 kg)     Height 06/25/17 2122 5\' 1"  (1.549 m)     Head Circumference --      Peak Flow --      Pain Score 06/25/17 2122 0     Pain Loc --      Pain Edu? --      Excl. in GC? --     Constitutional: Alert, confused.  Comfortable appearing and in no acute distress. Eyes: Conjunctivae are normal.  EOMI.  PERRLA. Head: Atraumatic. Nose: No congestion/rhinnorhea. Mouth/Throat: Mucous membranes are somewhat dry.   Neck: Normal range of motion.  Cardiovascular: Normal rate, regular rhythm. Grossly normal heart sounds.  Good peripheral circulation. Respiratory: Normal respiratory effort.  No retractions. Lungs CTAB. Gastrointestinal: Soft and nontender. No distention.  Genitourinary: No flank tenderness. Musculoskeletal: 2+ bilateral lower extremity edema.  Extremities warm and well perfused.  Neurologic: Motor intact in all extremities. Skin:  Skin is warm and dry. No rash noted. Psychiatric: Unable to assess due to dementia.  ____________________________________________   LABS (all labs ordered are listed, but only abnormal results are displayed)  Labs Reviewed  COMPREHENSIVE METABOLIC PANEL - Abnormal; Notable for the following components:      Result Value   Sodium 133 (*)    Potassium 3.0 (*)    Chloride 89 (*)    Glucose, Bld 113 (*)    BUN 97 (*)    Creatinine, Ser 2.46 (*)    ALT 13 (*)    GFR calc non Af Amer  16 (*)    GFR calc Af Amer 18 (*)    Anion gap 16 (*)    All other components within normal limits  CBC WITH DIFFERENTIAL/PLATELET - Abnormal; Notable for the following components:   Hemoglobin 11.6 (*)    HCT 34.7 (*)    RDW 14.7 (*)    All other components within normal limits  URINALYSIS, COMPLETE (UACMP) WITH MICROSCOPIC - Abnormal; Notable for the following components:   Color, Urine YELLOW (*)    APPearance CLEAR (*)    All other components within normal limits  TROPONIN I  BRAIN NATRIURETIC PEPTIDE   ____________________________________________  EKG  ED ECG REPORT I, Dionne Bucy, the attending physician, personally viewed and interpreted this  ECG.  Date: 06/25/2017 EKG Time: 2119 Rate: 59 Rhythm: normal sinus rhythm QRS Axis: normal Intervals: normal ST/T Wave abnormalities: LVH with no other significant findings Narrative Interpretation: no evidence of acute ischemia    ____________________________________________  RADIOLOGY  CXR: Atelectasis with no focal infiltrate  ____________________________________________   PROCEDURES  Procedure(s) performed: No  Procedures  Critical Care performed: No ____________________________________________   INITIAL IMPRESSION / ASSESSMENT AND PLAN / ED COURSE  Pertinent labs & imaging results that were available during my care of the patient were reviewed by me and considered in my medical decision making (see chart for details).  84 year old female with PMH as noted above presents with an episode of shortness of breath earlier this evening, as well as increased confusion and generalized weakness.  The patient has dementia and is unable to give any history.  I was able to obtain some history from the patient's daughter and son-in-law.  On exam, the vital signs are normal, the patient is quite well-appearing.  The remainder of the exam is as described above.  Overall given that the vital signs are now normal,  patient has no shortness of breath or respiratory distress, and just has mild generalized weakness, the differential includes dehydration, lecture li electrolyte abnormality, UTI, viral syndrome, bronchitis, or less likely pneumonia.  Will obtain chest x-ray, labs, UA, and reassess.  ----------------------------------------- 12:00 AM on 06/26/2017 -----------------------------------------  Chest x-ray shows no significant findings.  Lab work-up is unremarkable except for elevated creatinine and BUN, as well as borderline low potassium.  I had an extensive discussion with the patient's daughter (who is a retired Charity fundraiser) and son-in-law about the goals of care and the plan for the patient.  I offered to admit the patient for acute renal insufficiency and IV hydration if this was what the family wanted, however they stated that in general the patient is chronically been declining, and has had gradually decreased p.o. intake, and that they prefer that she not be admitted unless absolutely necessary.  We discussed possible alternative plans, and agreed to give the patient IV fluid bolus and some potassium repletion in the ED.  She will then be discharged back to her facility and her labs can be tracked by her regular doctor there.  Return precautions given.  I am signing the patient out to the oncoming physician Dr. York Cerise who will discharge her once the infusion is complete.  ____________________________________________   FINAL CLINICAL IMPRESSION(S) / ED DIAGNOSES  Final diagnoses:  Dehydration  Hypokalemia      NEW MEDICATIONS STARTED DURING THIS VISIT:  New Prescriptions   No medications on file     Note:  This document was prepared using Dragon voice recognition software and may include unintentional dictation errors.    Dionne Bucy, MD 06/26/17 0002

## 2017-06-25 NOTE — ED Triage Notes (Signed)
Pt arrived from [facility] via EMS with complaints of difficulty breathing. Pt has wheezing and cyanosis around her fingers and toes. Pt has a DNR and medication list at bedside. Pt has received a Duoneb and inhaler at the facility. Pt has no complaints at this time. Pt has edema bilaterly. Pt has Hx of Dementia. Vs per EMS BP-130/60 HR-58 O2sat-95%RA. Pt not normally on any oxygen at home.

## 2017-06-26 MED ORDER — POTASSIUM CHLORIDE 2 MEQ/ML IV SOLN
INTRAVENOUS | Status: AC
  Administered 2017-06-26: via INTRAVENOUS
  Filled 2017-06-26: qty 1000

## 2017-06-26 NOTE — ED Provider Notes (Signed)
-----------------------------------------   12:55 AM on 06/26/2017 -----------------------------------------  Assumed care from Dr. Marisa Severin.  Refer to his note for details, but in short, admission for acute renal insufficiency/failure was offered but declined by the patient's daughter.  She will be discharged once the fluid bolus is complete.   Loleta Rose, MD 06/26/17 [phone]

## 2017-06-26 NOTE — Discharge Instructions (Signed)
Amanda Mercado is labs should be rechecked sometime next week to make sure that her potassium is adequate and that she is not going into worsening renal insufficiency.   She may return to the emergency department at any time if she has worsening confusion, weakness, difficulty breathing, fever, or any other new or worsening symptoms that concern you or facility staff.

## 2017-06-27 ENCOUNTER — Inpatient Hospital Stay
Admission: EM | Admit: 2017-06-27 | Discharge: 2017-06-28 | DRG: 684 | Disposition: A | Discharge status: Nursing Home (Includes ICF) | Payer: Medicare Other | Attending: Internal Medicine | Admitting: Internal Medicine

## 2017-06-27 ENCOUNTER — Emergency Department: Payer: Medicare Other

## 2017-06-27 ENCOUNTER — Other Ambulatory Visit: Payer: Self-pay

## 2017-06-27 DIAGNOSIS — Z9841 Cataract extraction status, right eye: Secondary | ICD-10-CM

## 2017-06-27 DIAGNOSIS — Z961 Presence of intraocular lens: Secondary | ICD-10-CM | POA: Diagnosis present

## 2017-06-27 DIAGNOSIS — Z87891 Personal history of nicotine dependence: Secondary | ICD-10-CM

## 2017-06-27 DIAGNOSIS — I10 Essential (primary) hypertension: Secondary | ICD-10-CM | POA: Diagnosis present

## 2017-06-27 DIAGNOSIS — Z7401 Bed confinement status: Secondary | ICD-10-CM

## 2017-06-27 DIAGNOSIS — Z79899 Other long term (current) drug therapy: Secondary | ICD-10-CM

## 2017-06-27 DIAGNOSIS — E876 Hypokalemia: Secondary | ICD-10-CM

## 2017-06-27 DIAGNOSIS — E039 Hypothyroidism, unspecified: Secondary | ICD-10-CM | POA: Diagnosis not present

## 2017-06-27 DIAGNOSIS — F0390 Unspecified dementia without behavioral disturbance: Secondary | ICD-10-CM | POA: Diagnosis present

## 2017-06-27 DIAGNOSIS — Z90710 Acquired absence of both cervix and uterus: Secondary | ICD-10-CM

## 2017-06-27 DIAGNOSIS — K219 Gastro-esophageal reflux disease without esophagitis: Secondary | ICD-10-CM | POA: Diagnosis not present

## 2017-06-27 DIAGNOSIS — Z66 Do not resuscitate: Secondary | ICD-10-CM | POA: Diagnosis present

## 2017-06-27 DIAGNOSIS — Z79890 Hormone replacement therapy (postmenopausal): Secondary | ICD-10-CM

## 2017-06-27 DIAGNOSIS — H9190 Unspecified hearing loss, unspecified ear: Secondary | ICD-10-CM | POA: Diagnosis present

## 2017-06-27 DIAGNOSIS — E119 Type 2 diabetes mellitus without complications: Secondary | ICD-10-CM | POA: Diagnosis present

## 2017-06-27 DIAGNOSIS — Z79891 Long term (current) use of opiate analgesic: Secondary | ICD-10-CM

## 2017-06-27 DIAGNOSIS — R4182 Altered mental status, unspecified: Secondary | ICD-10-CM | POA: Diagnosis not present

## 2017-06-27 DIAGNOSIS — E860 Dehydration: Secondary | ICD-10-CM | POA: Diagnosis present

## 2017-06-27 DIAGNOSIS — Z888 Allergy status to other drugs, medicaments and biological substances status: Secondary | ICD-10-CM

## 2017-06-27 DIAGNOSIS — N179 Acute kidney failure, unspecified: Secondary | ICD-10-CM | POA: Diagnosis not present

## 2017-06-27 DIAGNOSIS — T502X5A Adverse effect of carbonic-anhydrase inhibitors, benzothiadiazides and other diuretics, initial encounter: Secondary | ICD-10-CM | POA: Diagnosis not present

## 2017-06-27 DIAGNOSIS — Z9842 Cataract extraction status, left eye: Secondary | ICD-10-CM | POA: Diagnosis not present

## 2017-06-27 DIAGNOSIS — L409 Psoriasis, unspecified: Secondary | ICD-10-CM | POA: Diagnosis not present

## 2017-06-27 DIAGNOSIS — J449 Chronic obstructive pulmonary disease, unspecified: Secondary | ICD-10-CM | POA: Diagnosis present

## 2017-06-27 LAB — URINALYSIS, COMPLETE (UACMP) WITH MICROSCOPIC
BILIRUBIN URINE: NEGATIVE
Bacteria, UA: NONE SEEN
Glucose, UA: NEGATIVE mg/dL
HGB URINE DIPSTICK: NEGATIVE
Ketones, ur: NEGATIVE mg/dL
LEUKOCYTES UA: NEGATIVE
NITRITE: NEGATIVE
PH: 6 (ref 5.0–8.0)
Protein, ur: NEGATIVE mg/dL
SPECIFIC GRAVITY, URINE: 1.008 (ref 1.005–1.030)
Squamous Epithelial / LPF: NONE SEEN (ref 0–5)

## 2017-06-27 LAB — LACTIC ACID, PLASMA
LACTIC ACID, VENOUS: 0.8 mmol/L (ref 0.5–1.9)
LACTIC ACID, VENOUS: 0.9 mmol/L (ref 0.5–1.9)

## 2017-06-27 LAB — CBC WITH DIFFERENTIAL/PLATELET
BASOS PCT: 1 %
Basophils Absolute: 0.1 10*3/uL (ref 0–0.1)
EOS ABS: 0.5 10*3/uL (ref 0–0.7)
Eosinophils Relative: 6 %
HCT: 34.4 % — ABNORMAL LOW (ref 35.0–47.0)
HEMOGLOBIN: 11.5 g/dL — AB (ref 12.0–16.0)
LYMPHS ABS: 2.7 10*3/uL (ref 1.0–3.6)
Lymphocytes Relative: 37 %
MCH: 29.3 pg (ref 26.0–34.0)
MCHC: 33.4 g/dL (ref 32.0–36.0)
MCV: 87.8 fL (ref 80.0–100.0)
Monocytes Absolute: 0.7 10*3/uL (ref 0.2–0.9)
Monocytes Relative: 10 %
NEUTROS PCT: 46 %
Neutro Abs: 3.4 10*3/uL (ref 1.4–6.5)
Platelets: 259 10*3/uL (ref 150–440)
RBC: 3.92 MIL/uL (ref 3.80–5.20)
RDW: 14.8 % — ABNORMAL HIGH (ref 11.5–14.5)
WBC: 7.3 10*3/uL (ref 3.6–11.0)

## 2017-06-27 LAB — BASIC METABOLIC PANEL
Anion gap: 14 (ref 5–15)
BUN: 88 mg/dL — ABNORMAL HIGH (ref 6–20)
CHLORIDE: 90 mmol/L — AB (ref 101–111)
CO2: 31 mmol/L (ref 22–32)
Calcium: 9.3 mg/dL (ref 8.9–10.3)
Creatinine, Ser: 2.15 mg/dL — ABNORMAL HIGH (ref 0.44–1.00)
GFR calc non Af Amer: 19 mL/min — ABNORMAL LOW (ref >60)
GFR, EST AFRICAN AMERICAN: 22 mL/min — AB (ref >60)
Glucose, Bld: 85 mg/dL (ref 65–99)
POTASSIUM: 2.8 mmol/L — AB (ref 3.5–5.1)
SODIUM: 135 mmol/L (ref 135–145)

## 2017-06-27 LAB — GLUCOSE, CAPILLARY: GLUCOSE-CAPILLARY: 69 mg/dL (ref 65–99)

## 2017-06-27 LAB — PROCALCITONIN: Procalcitonin: 0.12 ng/mL

## 2017-06-27 MED ORDER — SERTRALINE HCL 50 MG PO TABS
50.0000 mg | ORAL_TABLET | Freq: Every day | ORAL | Status: DC
  Administered 2017-06-28: 50 mg via ORAL
  Filled 2017-06-27: qty 1

## 2017-06-27 MED ORDER — LORATADINE 10 MG PO TABS
10.0000 mg | ORAL_TABLET | Freq: Every day | ORAL | Status: DC
  Administered 2017-06-28: 10 mg via ORAL
  Filled 2017-06-27: qty 1

## 2017-06-27 MED ORDER — POTASSIUM CHLORIDE IN NACL 20-0.9 MEQ/L-% IV SOLN
INTRAVENOUS | Status: DC
  Administered 2017-06-27: 22:00:00 via INTRAVENOUS
  Filled 2017-06-27: qty 1000

## 2017-06-27 MED ORDER — ONDANSETRON HCL 4 MG PO TABS: 4.0000 mg | ORAL_TABLET | Freq: Four times a day (QID) | ORAL | Status: DC | PRN

## 2017-06-27 MED ORDER — BRIMONIDINE TARTRATE-TIMOLOL 0.2-0.5 % OP SOLN
1.0000 [drp] | Freq: Two times a day (BID) | OPHTHALMIC | Status: DC
  Filled 2017-06-27: qty 5

## 2017-06-27 MED ORDER — LEVOTHYROXINE SODIUM 50 MCG PO TABS
75.0000 ug | ORAL_TABLET | Freq: Every day | ORAL | Status: DC
  Administered 2017-06-28: 75 ug via ORAL
  Filled 2017-06-27: qty 2

## 2017-06-27 MED ORDER — POTASSIUM CHLORIDE 20 MEQ PO PACK
40.0000 meq | PACK | Freq: Once | ORAL | Status: AC
  Administered 2017-06-27: 40 meq via ORAL
  Filled 2017-06-27: qty 2

## 2017-06-27 MED ORDER — BRIMONIDINE TARTRATE 0.2 % OP SOLN
1.0000 [drp] | Freq: Three times a day (TID) | OPHTHALMIC | Status: DC
  Administered 2017-06-27 – 2017-06-28 (×2): 1 [drp] via OPHTHALMIC
  Filled 2017-06-27: qty 5

## 2017-06-27 MED ORDER — TIMOLOL MALEATE 0.5 % OP SOLN
1.0000 [drp] | Freq: Two times a day (BID) | OPHTHALMIC | Status: DC
  Administered 2017-06-27 – 2017-06-28 (×2): 1 [drp] via OPHTHALMIC
  Filled 2017-06-27: qty 5

## 2017-06-27 MED ORDER — TRAMADOL HCL 50 MG PO TABS
50.0000 mg | ORAL_TABLET | Freq: Three times a day (TID) | ORAL | Status: DC
  Administered 2017-06-28: 50 mg via ORAL
  Filled 2017-06-27: qty 1

## 2017-06-27 MED ORDER — ALBUTEROL SULFATE (2.5 MG/3ML) 0.083% IN NEBU
2.5000 mg | INHALATION_SOLUTION | Freq: Four times a day (QID) | RESPIRATORY_TRACT | Status: DC
  Administered 2017-06-27 – 2017-06-28 (×2): 2.5 mg via RESPIRATORY_TRACT
  Filled 2017-06-27 (×3): qty 3

## 2017-06-27 MED ORDER — MIRTAZAPINE 15 MG PO TABS
7.5000 mg | ORAL_TABLET | Freq: Every day | ORAL | Status: DC
  Administered 2017-06-27: 7.5 mg via ORAL
  Filled 2017-06-27: qty 1

## 2017-06-27 MED ORDER — HEPARIN SODIUM (PORCINE) 5000 UNIT/ML IJ SOLN
5000.0000 [IU] | Freq: Three times a day (TID) | INTRAMUSCULAR | Status: DC
  Administered 2017-06-27 – 2017-06-28 (×2): 5000 [IU] via SUBCUTANEOUS
  Filled 2017-06-27 (×2): qty 1

## 2017-06-27 MED ORDER — HYDROCORTISONE ACETATE 25 MG RE SUPP
25.0000 mg | Freq: Two times a day (BID) | RECTAL | Status: DC
  Administered 2017-06-28: 25 mg via RECTAL
  Filled 2017-06-27 (×3): qty 1

## 2017-06-27 MED ORDER — SENNOSIDES-DOCUSATE SODIUM 8.6-50 MG PO TABS
2.0000 | ORAL_TABLET | Freq: Every day | ORAL | Status: DC
  Administered 2017-06-28: 2 via ORAL
  Filled 2017-06-27: qty 2

## 2017-06-27 MED ORDER — ACETAMINOPHEN 650 MG RE SUPP: 650.0000 mg | Freq: Four times a day (QID) | RECTAL | Status: DC | PRN

## 2017-06-27 MED ORDER — INSULIN ASPART 100 UNIT/ML ~~LOC~~ SOLN: 0.0000 [IU] | Freq: Every day | SUBCUTANEOUS | Status: DC

## 2017-06-27 MED ORDER — ACETAMINOPHEN 325 MG PO TABS
650.0000 mg | ORAL_TABLET | Freq: Four times a day (QID) | ORAL | Status: DC | PRN
  Administered 2017-06-27 – 2017-06-28 (×2): 650 mg via ORAL
  Filled 2017-06-27 (×2): qty 2

## 2017-06-27 MED ORDER — INSULIN ASPART 100 UNIT/ML ~~LOC~~ SOLN: 0.0000 [IU] | Freq: Three times a day (TID) | SUBCUTANEOUS | Status: DC

## 2017-06-27 MED ORDER — SODIUM CHLORIDE 0.9 % IV BOLUS
1000.0000 mL | Freq: Once | INTRAVENOUS | Status: AC
  Administered 2017-06-27: 1000 mL via INTRAVENOUS

## 2017-06-27 MED ORDER — EZETIMIBE 10 MG PO TABS
10.0000 mg | ORAL_TABLET | Freq: Every day | ORAL | Status: DC
  Administered 2017-06-28: 10 mg via ORAL
  Filled 2017-06-27 (×2): qty 1

## 2017-06-27 MED ORDER — ONDANSETRON HCL 4 MG/2ML IJ SOLN: 4.0000 mg | Freq: Four times a day (QID) | INTRAMUSCULAR | Status: DC | PRN

## 2017-06-27 MED ORDER — POLYETHYLENE GLYCOL 3350 17 G PO PACK: 17.0000 g | PACK | Freq: Every day | ORAL | Status: DC | PRN

## 2017-06-27 MED ORDER — POLYETHYLENE GLYCOL 3350 17 G PO PACK
17.0000 g | PACK | Freq: Every day | ORAL | Status: DC
  Administered 2017-06-28: 17 g via ORAL
  Filled 2017-06-27: qty 1

## 2017-06-27 MED ORDER — MECLIZINE HCL 25 MG PO TABS
25.0000 mg | ORAL_TABLET | Freq: Three times a day (TID) | ORAL | Status: DC | PRN
  Filled 2017-06-27: qty 1

## 2017-06-27 NOTE — ED Provider Notes (Signed)
[facility] Emergency Department Provider Note  ____________________________________________   I have reviewed the triage vital signs and the nursing notes.   HISTORY  Chief Complaint Altered Mental Status (husband states tis chnage in ms started today/ pt lives at memory care center/ recently diagnosed with kidney infection/pt speaks when spoken to)   History limited by and level 5 caveat due to: Altered Mental Status   HPI Amanda Mercado is a 84 y.o. female who presents to the emergency department today because of concerns for altered mental status.  Per EMS report this started this morning.  The patient states that she feels "rough".  She however cannot further specify exactly what this means.  She was noted to have a cough and when asked she states she has been having shortness of breath for a while.   Per medical record review patient has a history of emergency department visit 2 days ago with diagnosis of acute kidney injury.  At that time she was offered admission however family declined.  Past Medical History:  Diagnosis Date  . Arthritis    ra  . COPD (chronic obstructive pulmonary disease) (HCC)   . Diabetes mellitus without complication (HCC)   . Dizziness    occ  . GERD (gastroesophageal reflux disease)   . HOH (hard of hearing)   . Hypertension   . Hypothyroidism   . Psoriasis   . Rosacea   . Shortness of breath dyspnea     There are no active problems to display for this patient.   Past Surgical History:  Procedure Laterality Date  . ABDOMINAL HYSTERECTOMY    . ANTERIOR VITRECTOMY Left 08/01/2014   Procedure: ANTERIOR VITRECTOMY;  Surgeon: Galen Manila, MD;  Location: [facility] ORS;  Service: Ophthalmology;  Laterality: Left;  . BACK SURGERY    . CATARACT EXTRACTION W/PHACO Right 07/18/2014   Procedure: CATARACT EXTRACTION PHACO AND INTRAOCULAR LENS PLACEMENT (IOC);  Surgeon: Galen Manila, MD;  Location: [facility] ORS;  Service:  Ophthalmology;  Laterality: Right;  [place] 01:18 AP% 26.9 CDE 21.20 fluid pack lot #[id number]  . CATARACT EXTRACTION W/PHACO Left 08/01/2014   Procedure: CATARACT EXTRACTION PHACO AND INTRAOCULAR LENS PLACEMENT (IOC);  Surgeon: Galen Manila, MD;  Location: [facility] ORS;  Service: Ophthalmology;  Laterality: Left;  cassette lot # [id number]   [place] 1:35.6AP  25.3 CDE 24.7    Prior to Admission medications   Medication Sig Start Date End Date Taking? Authorizing Provider  acetaminophen (TYLENOL) 500 MG tablet Take 500 mg by mouth every 6 (six) hours as needed.    [provider]  brimonidine-timolol (COMBIGAN) 0.2-0.5 % ophthalmic solution Place 1 drop into both eyes every 12 (twelve) hours.    [provider]  Cholecalciferol (VITAMIN D) 2000 units CAPS Take 4,000 Units by mouth daily.     [provider]  ezetimibe (ZETIA) 10 MG tablet Take 10 mg by mouth daily.    [provider]  Ginkgo 60 MG TABS Take 1 tablet by mouth daily.    [provider]  hydrocortisone (ANUSOL-HC) 25 MG suppository Place 25 mg rectally 2 (two) times daily.    [provider]  levothyroxine (SYNTHROID, LEVOTHROID) 75 MCG tablet Take 75 mcg by mouth daily before breakfast.    [provider]  loratadine (CLARITIN) 10 MG tablet Take 10 mg by mouth daily.    [provider]  losartan-hydrochlorothiazide (HYZAAR) 100-12.5 MG per tablet Take 1 tablet by mouth daily.    [provider]  meclizine (ANTIVERT) 25 MG tablet Take 25 mg by mouth 3 (three) times daily as needed for dizziness.    [provider]  mirtazapine (REMERON) 7.5 MG tablet Take 1 tablet by mouth at bedtime. 06/15/17   [provider]  polyethylene glycol (MIRALAX / GLYCOLAX) packet Take 17 g by mouth daily as needed.    [provider]  sennosides-docusate sodium (SENOKOT-S) 8.6-50 MG tablet Take 1 tablet by mouth daily.    [provider]  traMADol  (ULTRAM) 50 MG tablet Take 50 mg by mouth every 6 (six) hours as needed.    [provider]  vitamin B-12 (CYANOCOBALAMIN) 1000 MCG tablet Take 1,000 mcg by mouth daily.    [provider]    Allergies Statins  No family history on file.  Social History Social History   Tobacco Use  . Smoking status: Former Games developer  . Smokeless tobacco: Never Used  Substance Use Topics  . Alcohol use: No  . Drug use: Not on file    Review of Systems Unable to obtain reliable ROS secondary to AMS ____________________________________________   PHYSICAL EXAM:  VITAL SIGNS: ED Triage Vitals  Enc Vitals Group     BP 06/27/17 1514 (!) 153/76     Pulse Rate 06/27/17 1514 71     Resp 06/27/17 1514 20     Temp 06/27/17 1514 (!) 97.5 F (36.4 C)     Temp Source 06/27/17 1514 Oral     SpO2 06/27/17 1514 97 %     Weight 06/27/17 1516 135 lb (61.2 kg)     Height 06/27/17 1516 5' (1.524 m)     Head Circumference --      Peak Flow --      Pain Score 06/27/17 1516 9   Constitutional: Awake and alert to name.  Eyes: Conjunctivae are normal.  ENT      Head: Normocephalic and atraumatic.      Nose: No congestion/rhinnorhea.      Mouth/Throat: Mucous membranes are moist.      Neck: No stridor. Hematological/Lymphatic/Immunilogical: No cervical lymphadenopathy. Cardiovascular: Normal rate, regular rhythm.  No murmurs, rubs, or gallops.  Respiratory: Slightly increased respiratory effort. Diffuse expiratory wheezing.  Gastrointestinal: Soft and non tender. No rebound. No guarding.  Genitourinary: Deferred Musculoskeletal: Normal range of motion in all extremities. No lower extremity edema. Neurologic:  Not completely oriented. No gross focal neurologic deficits are appreciated.  Skin:  Skin is warm, dry and intact. No rash noted. ____________________________________________    LABS (pertinent positives/negatives)  CBC wbc 7.3, hgb 11.5, plt 259 BMP na 135, k 2.8, cr  2.15 Lactic 0.9 UA 6-10 wbc otherwise unremarkable  ____________________________________________   EKG  None  ____________________________________________    RADIOLOGY  CXR Question atelectasis and pneumonia  ____________________________________________   PROCEDURES  Procedures  ____________________________________________   INITIAL IMPRESSION / ASSESSMENT AND PLAN / ED COURSE  Pertinent labs & imaging results that were available during my care of the patient were reviewed by me and considered in my medical decision making (see chart for details).   Patient presented to the emergency department today because of continued weakness and altered mental status.  Patient's differential would be broad including infection and electrolyte abnormality intracranial process was other etiologies.  Work-up does show continued acute kidney injury.  Also shows progressive hypokalemia.  Furthermore her urine had 6-10 white blood cells but no other findings consistent with infection.  This will be sent for culture.  X-ray is read  as possible pneumonia versus atelectasis.  Procalcitonin was was sent.  Patient has no leukocytosis or left shift on blood work at this time.  Discussed all these findings with patient and family.  This point will plan on admission for IV hydration and repletion of potassium.   ____________________________________________   FINAL CLINICAL IMPRESSION(S) / ED DIAGNOSES  Final diagnoses:  Dehydration  Altered mental status, unspecified altered mental status type  Hypokalemia     Note: This dictation was prepared with Dragon dictation. Any transcriptional errors that result from this process are unintentional     Phineas Semen, MD 06/27/17 1721

## 2017-06-27 NOTE — H&P (Signed)
[facility] - [place] at [facility]   PATIENT NAME: Amanda Mercado    MR#:  [id number]  DATE OF BIRTH:  Feb 04, 1921  DATE OF ADMISSION:  06/27/2017  PRIMARY CARE PHYSICIAN: System, Pcp Not In   REQUESTING/REFERRING PHYSICIAN: Dr. Derrill Kay  CHIEF COMPLAINT:   Chief Complaint  Patient presents with  . Altered Mental Status    husband states tis chnage in ms started today/ pt lives at memory care center/ recently diagnosed with kidney infection/pt speaks when spoken to    HISTORY OF PRESENT ILLNESS:  Amanda Mercado  is a 84 y.o. female with a known history of hypertension, dementia, COPD presents to the emergency room due to confusion.  Patient was found to be unresponsive at the nursing home.  Here patient is slowly improving.  She was recently seen in the hospital and found to have mild dehydration and was given IV fluids and discharged back home from the emergency room.  Today she has been found to have acute kidney injury, hypokalemia and is being admitted to the hospitalist service.  Patient has no respiratory symptoms.  Saturations are normal.  Right lower lobe atelectasis versus infiltrate found on chest x-ray.  Procalcitonin pending.  PAST MEDICAL HISTORY:   Past Medical History:  Diagnosis Date  . Arthritis    ra  . COPD (chronic obstructive pulmonary disease) (HCC)   . Diabetes mellitus without complication (HCC)   . Dizziness    occ  . GERD (gastroesophageal reflux disease)   . HOH (hard of hearing)   . Hypertension   . Hypothyroidism   . Psoriasis   . Rosacea   . Shortness of breath dyspnea     PAST SURGICAL HISTORY:   Past Surgical History:  Procedure Laterality Date  . ABDOMINAL HYSTERECTOMY    . ANTERIOR VITRECTOMY Left 08/01/2014   Procedure: ANTERIOR VITRECTOMY;  Surgeon: Galen Manila, MD;  Location: [facility] ORS;  Service: Ophthalmology;  Laterality: Left;  . BACK SURGERY    . CATARACT EXTRACTION W/PHACO Right 07/18/2014   Procedure:  CATARACT EXTRACTION PHACO AND INTRAOCULAR LENS PLACEMENT (IOC);  Surgeon: Galen Manila, MD;  Location: [facility] ORS;  Service: Ophthalmology;  Laterality: Right;  [place] 01:18 AP% 26.9 CDE 21.20 fluid pack lot #[id number]  . CATARACT EXTRACTION W/PHACO Left 08/01/2014   Procedure: CATARACT EXTRACTION PHACO AND INTRAOCULAR LENS PLACEMENT (IOC);  Surgeon: Galen Manila, MD;  Location: [facility] ORS;  Service: Ophthalmology;  Laterality: Left;  cassette lot # [id number]   [place] 1:35.6AP  25.3 CDE 24.7    SOCIAL HISTORY:   Social History   Tobacco Use  . Smoking status: Former Games developer  . Smokeless tobacco: Never Used  Substance Use Topics  . Alcohol use: No    FAMILY HISTORY:  No family history on file.  DRUG ALLERGIES:   Allergies  Allergen Reactions  . Statins Other (See Comments)    Entire body aches, bones and muscles    REVIEW OF SYSTEMS:   Review of Systems  Unable to perform ROS: Dementia    MEDICATIONS AT HOME:   Prior to Admission medications   Medication Sig Start Date End Date Taking? Authorizing Provider  acetaminophen (TYLENOL) 500 MG tablet Take 500 mg by mouth every 4 (four) hours as needed for mild pain.    Yes [provider]  albuterol (PROVENTIL HFA;VENTOLIN HFA) 108 (90 Base) MCG/ACT inhaler Inhale 2 puffs into the lungs every 4 (four) hours as needed for wheezing or shortness of breath.   Yes [provider]  brimonidine-timolol (COMBIGAN) 0.2-0.5 % ophthalmic solution Place 1 drop into both eyes every 12 (twelve) hours.   Yes [provider]  Cholecalciferol (VITAMIN D) 2000 units CAPS Take 4,000 Units by mouth daily.    Yes [provider]  dimenhyDRINATE (DRAMAMINE) 50 MG CHEW Chew 1 tablet by mouth every 4 (four) hours as needed.   Yes [provider]  ezetimibe (ZETIA) 10 MG tablet Take 10 mg by mouth daily.   Yes [provider]  Ginkgo 60 MG TABS Take 120 mg by mouth daily.    Yes [provider]   hydrocortisone (ANUSOL-HC) 25 MG suppository Place 25 mg rectally 2 (two) times daily.   Yes [provider]  levothyroxine (SYNTHROID, LEVOTHROID) 75 MCG tablet Take 75 mcg by mouth daily before breakfast.   Yes [provider]  loratadine (CLARITIN) 10 MG tablet Take 10 mg by mouth daily.   Yes [provider]  losartan-hydrochlorothiazide (HYZAAR) 100-12.5 MG per tablet Take 1 tablet by mouth daily.   Yes [provider]  meclizine (ANTIVERT) 25 MG tablet Take 25 mg by mouth every 8 (eight) hours as needed for dizziness.    Yes [provider]  mirtazapine (REMERON) 7.5 MG tablet Take 1 tablet by mouth at bedtime. 06/15/17  Yes [provider]  polyethylene glycol (MIRALAX / GLYCOLAX) packet Take 17 g by mouth daily.    Yes [provider]  sennosides-docusate sodium (SENOKOT-S) 8.6-50 MG tablet Take 2 tablets by mouth daily.    Yes [provider]  sertraline (ZOLOFT) 50 MG tablet Take 50 mg by mouth daily.   Yes [provider]  torsemide (DEMADEX) 20 MG tablet Take 40 mg by mouth daily.   Yes [provider]  traMADol (ULTRAM) 50 MG tablet Take 50 mg by mouth 3 (three) times daily.    Yes [provider]  vitamin B-12 (CYANOCOBALAMIN) 1000 MCG tablet Take 1,000 mcg by mouth daily.   Yes [provider]     VITAL SIGNS:  Blood pressure 133/73, pulse 74, temperature (!) 97.5 F (36.4 C), temperature source Oral, resp. rate 14, height 5' (1.524 m), weight 61.2 kg (135 lb), SpO2 99 %.  PHYSICAL EXAMINATION:  Physical Exam  GENERAL:  84 y.o.-year-old patient lying in the bed with no acute distress.  EYES: Pupils equal, round, reactive to light and accommodation. No scleral icterus. Extraocular muscles intact.  HEENT: Head atraumatic, normocephalic. Oropharynx and nasopharynx clear. No oropharyngeal erythema, moist oral mucosa  NECK:  Supple, no jugular venous distention. No thyroid  enlargement, no tenderness.  LUNGS: Normal breath sounds bilaterally, no wheezing, rales, rhonchi. No use of accessory muscles of respiration.  CARDIOVASCULAR: S1, S2 normal. No murmurs, rubs, or gallops.  ABDOMEN: Soft, nontender, nondistended. Bowel sounds present. No organomegaly or mass.  EXTREMITIES: No pedal edema, cyanosis, or clubbing. + 2 pedal & radial pulses b/l.   NEUROLOGIC: Moving all 4 extremities symmetrically PSYCHIATRIC: The patient is alert and awake.  Pleasantly confused SKIN: No obvious rash, lesion, or ulcer.   LABORATORY PANEL:   CBC Recent Labs  Lab 06/27/17 1618  WBC 7.3  HGB 11.5*  HCT 34.4*  PLT 259   ------------------------------------------------------------------------------------------------------------------  Chemistries  Recent Labs  Lab 06/25/17 2117 06/27/17 1618  NA 133* 135  K 3.0* 2.8*  CL 89* 90*  CO2 28 31  GLUCOSE 113* 85  BUN 97* 88*  CREATININE 2.46* 2.15*  CALCIUM 9.6 9.3  AST 23  --  ALT 13*  --   ALKPHOS 69  --   BILITOT 0.7  --    ------------------------------------------------------------------------------------------------------------------  Cardiac Enzymes Recent Labs  Lab 06/25/17 2117  TROPONINI <0.03   ------------------------------------------------------------------------------------------------------------------  RADIOLOGY:  Dg Chest Portable 1 View  Result Date: 06/27/2017 CLINICAL DATA:  Unresolved weakness for several days. Wheezing. Ex-smoker. EXAM: PORTABLE CHEST 1 VIEW COMPARISON:  06/25/2017. FINDINGS: Normal sized heart. Tortuous and calcified thoracic aorta. The right hemidiaphragm remains elevated. Mild increase in diffuse prominence of the interstitial markings. Mild linear and patchy density at the right lung base. Calcified granulomata at the left lung base. Moderate left and mild right shoulder degenerative changes. Thoracic spine degenerative changes and mild scoliosis. IMPRESSION: 1. Mild  increase in prominence of the interstitial markings, compatible with interval mild interstitial pulmonary edema. 2. Mild right basilar atelectasis and possible pneumonia. Electronically Signed   By: Beckie Salts M.D.   On: 06/27/2017 16:20   Dg Chest Portable 1 View  Result Date: 06/25/2017 CLINICAL DATA:  Difficulty breathing. Wheezing and cyanosis or on the fingers and toes. Bilateral edema. Dementia. EXAM: PORTABLE CHEST 1 VIEW COMPARISON:  02/24/2017 FINDINGS: Shallow inspiration with elevation of the right hemidiaphragm. Linear atelectasis in the lung bases. No consolidation or edema. No blunting of costophrenic angles. No pneumothorax. Heart size and pulmonary vascularity are normal. Calcification of the aorta. Degenerative changes in the spine and shoulders. IMPRESSION: Shallow inspiration with elevation of the right hemidiaphragm. Linear atelectasis in the lung bases. No focal consolidation. Aortic atherosclerosis. Electronically Signed   By: Burman Nieves M.D.   On: 06/25/2017 22:22     IMPRESSION AND PLAN:   *Acute kidney injury due to dehydration.  Hold torsemide patient takes at home.  Start IV fluid.  Monitor input and output. Repeat labs in the morning.  *Hypokalemia due to being on diuretics.  Decreased oral intake.  Replace oral and IV.  Telemetry monitoring until potassium is improved. Repeat labs in the morning.  *Dementia.  Monitor for inpatient delirium.  *Hypertension.  Hold medication due to acute kidney injury.  *Right lower lobe infiltrate versus atelectasis.  Procalcitonin pending.  Lungs sound clear to auscultation.  No respiratory symptoms.  Start antibiotics if procalcitonin elevated.  *DVT prophylaxis with heparin subcu  All the records are reviewed and case discussed with ED provider. Management plans discussed with the patient, family and they are in agreement.  CODE STATUS: DNR  TOTAL TIME TAKING CARE OF THIS PATIENT: 40 minutes.   Molinda Bailiff Rashmi Tallent M.D  on 06/27/2017 at 6:00 PM  Between 7am to 6pm - Pager - [phone]  After 6pm go to [email or web address] - password [email or web address]  [facility]  Office  [phone]  CC: Primary care physician; System, Pcp Not In  Note: This dictation was prepared with Dragon dictation along with smaller phrase technology. Any transcriptional errors that result from this process are unintentional.

## 2017-06-27 NOTE — Progress Notes (Signed)
Purpose of Encounter Acute kidney injury, progressively worsening dementia, CODE STATUS discussion Goals of care  Parties in Attendance Patient in room but unable to participate in decision-making due to dementia. healthcare power of attorney her daughter at bedside.  Daughter-in-law in the room.  Patient used to live alone until she was 89 years.  Over the last 3 years she has developed dementia due to recurrent falls and needing more help has been placed in [facility].  Seems to have progressively worsening dementia and presently is bedbound. Patient has had decreased oral intake.  Daughter mentions that they would like acute kidney injury treated at this point.  I have explained that this will likely recur due to decreased oral intake.  Family understands patient will likely worsen and would like hospice set up if patient starts worsening. Discussed CODE STATUS.  Patient is DO NOT RESUSCITATE and DO NOT INTUBATE.  DNR/DNI  Time spent - 17 minutes

## 2017-06-28 DIAGNOSIS — E860 Dehydration: Secondary | ICD-10-CM | POA: Diagnosis not present

## 2017-06-28 DIAGNOSIS — N179 Acute kidney failure, unspecified: Secondary | ICD-10-CM | POA: Diagnosis not present

## 2017-06-28 LAB — COMPREHENSIVE METABOLIC PANEL
ALT: 13 U/L — AB (ref 14–54)
ANION GAP: 9 (ref 5–15)
AST: 20 U/L (ref 15–41)
Albumin: 3.4 g/dL — ABNORMAL LOW (ref 3.5–5.0)
Alkaline Phosphatase: 50 U/L (ref 38–126)
BUN: 75 mg/dL — ABNORMAL HIGH (ref 6–20)
CHLORIDE: 101 mmol/L (ref 101–111)
CO2: 28 mmol/L (ref 22–32)
CREATININE: 1.68 mg/dL — AB (ref 0.44–1.00)
Calcium: 8.9 mg/dL (ref 8.9–10.3)
GFR, EST AFRICAN AMERICAN: 29 mL/min — AB (ref >60)
GFR, EST NON AFRICAN AMERICAN: 25 mL/min — AB (ref >60)
Glucose, Bld: 80 mg/dL (ref 65–99)
POTASSIUM: 3.7 mmol/L (ref 3.5–5.1)
SODIUM: 138 mmol/L (ref 135–145)
Total Bilirubin: 0.8 mg/dL (ref 0.3–1.2)
Total Protein: 6.4 g/dL — ABNORMAL LOW (ref 6.5–8.1)

## 2017-06-28 LAB — GLUCOSE, CAPILLARY
GLUCOSE-CAPILLARY: 77 mg/dL (ref 65–99)
GLUCOSE-CAPILLARY: 118 mg/dL — AB (ref 65–99)

## 2017-06-28 LAB — HEMOGLOBIN A1C
HEMOGLOBIN A1C: 5.9 % — AB (ref 4.8–5.6)
MEAN PLASMA GLUCOSE: 122.63 mg/dL

## 2017-06-28 LAB — MRSA PCR SCREENING: MRSA by PCR: NEGATIVE

## 2017-06-28 NOTE — NC FL2 (Signed)
[place] MEDICAID FL2 LEVEL OF CARE SCREENING TOOL     IDENTIFICATION  Patient Name: Amanda Mercado Birthdate: 28-Apr-1921 Sex: female Admission Date (Current Location): 06/27/2017  [place] and [place] Number:  Chiropodist and Address:  [facility], [address], [place], [place] [address]      Provider Number: [id number]  Attending Physician Name and Address:  Milagros Loll, MD  Relative Name and Phone Number:  [name] (Daughter) [phone]    Current Level of Care: Hospital Recommended Level of Care: Memory Care, Assisted Living Facility Prior Approval Number:    Date Approved/Denied:   PASRR Number:    Discharge Plan: Domiciliary (Rest home)([facility])    Current Diagnoses: Patient Active Problem List   Diagnosis Date Noted  . AKI (acute kidney injury) (HCC) 06/27/2017    Orientation RESPIRATION BLADDER Height & Weight     Self  Normal Incontinent Weight: 143 lb 11.8 oz (65.2 kg) Height:  5' (152.4 cm)  BEHAVIORAL SYMPTOMS/MOOD NEUROLOGICAL BOWEL NUTRITION STATUS      Incontinent    AMBULATORY STATUS COMMUNICATION OF NEEDS Skin   Total Care Verbally Normal                       Personal Care Assistance Level of Assistance  Bathing, Feeding, Dressing Bathing Assistance: Maximum assistance Feeding assistance: Maximum assistance Dressing Assistance: Maximum assistance     Functional Limitations Info  Sight, Hearing, Speech Sight Info: Adequate Hearing Info: Impaired Speech Info: Adequate    SPECIAL CARE FACTORS FREQUENCY                       Contractures Contractures Info: Not present    Additional Factors Info  Code Status, Allergies Code Status Info: DNR Allergies Info: Statins           Current Medications (06/28/2017):  This is the current hospital active medication list Current Facility-Administered Medications  Medication Dose Route Frequency Provider Last Rate Last Dose   . acetaminophen (TYLENOL) tablet 650 mg  650 mg Oral Q6H PRN Milagros Loll, MD   650 mg at 06/28/17 7846   Or  . acetaminophen (TYLENOL) suppository 650 mg  650 mg Rectal Q6H PRN Sudini, Wardell Heath, MD      . albuterol (PROVENTIL) (2.5 MG/3ML) 0.083% nebulizer solution 2.5 mg  2.5 mg Nebulization Q6H Sudini, Srikar, MD   2.5 mg at 06/28/17 0739  . brimonidine (ALPHAGAN) 0.2 % ophthalmic solution 1 drop  1 drop Both Eyes TID Milagros Loll, MD   1 drop at 06/28/17 0832  . ezetimibe (ZETIA) tablet 10 mg  10 mg Oral Daily Milagros Loll, MD   10 mg at 06/28/17 0828  . heparin injection 5,000 Units  5,000 Units Subcutaneous Q8H Milagros Loll, MD   5,000 Units at 06/28/17 0603  . hydrocortisone (ANUSOL-HC) suppository 25 mg  25 mg Rectal BID Milagros Loll, MD   25 mg at 06/28/17 9629  . insulin aspart (novoLOG) injection 0-5 Units  0-5 Units Subcutaneous QHS Sudini, Srikar, MD      . insulin aspart (novoLOG) injection 0-9 Units  0-9 Units Subcutaneous TID WC Sudini, Srikar, MD      . levothyroxine (SYNTHROID, LEVOTHROID) tablet 75 mcg  75 mcg Oral QAC breakfast Milagros Loll, MD   75 mcg at 06/28/17 0747  . loratadine (CLARITIN) tablet 10 mg  10 mg Oral Daily Milagros Loll, MD   10 mg at 06/28/17  0827  . meclizine (ANTIVERT) tablet 25 mg  25 mg Oral Q8H PRN Sudini, Wardell Heath, MD      . mirtazapine (REMERON) tablet 7.5 mg  7.5 mg Oral QHS Milagros Loll, MD   7.5 mg at 06/27/17 2208  . ondansetron (ZOFRAN) tablet 4 mg  4 mg Oral Q6H PRN Sudini, Wardell Heath, MD       Or  . ondansetron (ZOFRAN) injection 4 mg  4 mg Intravenous Q6H PRN Sudini, Srikar, MD      . polyethylene glycol (MIRALAX / GLYCOLAX) packet 17 g  17 g Oral Daily PRN Sudini, Srikar, MD      . polyethylene glycol (MIRALAX / GLYCOLAX) packet 17 g  17 g Oral Daily Milagros Loll, MD   17 g at 06/28/17 0829  . senna-docusate (Senokot-S) tablet 2 tablet  2 tablet Oral Daily Milagros Loll, MD   2 tablet at 06/28/17 [phone]  . sertraline (ZOLOFT)  tablet 50 mg  50 mg Oral Daily Milagros Loll, MD   50 mg at 06/28/17 0827  . timolol (TIMOPTIC) 0.5 % ophthalmic solution 1 drop  1 drop Both Eyes BID Milagros Loll, MD   1 drop at 06/28/17 0831  . traMADol (ULTRAM) tablet 50 mg  50 mg Oral TID Milagros Loll, MD   50 mg at 06/28/17 1191     Discharge Medications: STOP taking these medications   torsemide 20 MG tablet Commonly known as:  DEMADEX     TAKE these medications   acetaminophen 500 MG tablet Commonly known as:  TYLENOL Take 500 mg by mouth every 4 (four) hours as needed for mild pain.   albuterol 108 (90 Base) MCG/ACT inhaler Commonly known as:  PROVENTIL HFA;VENTOLIN HFA Inhale 2 puffs into the lungs every 4 (four) hours as needed for wheezing or shortness of breath.   COMBIGAN 0.2-0.5 % ophthalmic solution Generic drug:  brimonidine-timolol Place 1 drop into both eyes every 12 (twelve) hours.   DRAMAMINE 50 MG Chew Generic drug:  dimenhyDRINATE Chew 1 tablet by mouth every 4 (four) hours as needed.   ezetimibe 10 MG tablet Commonly known as:  ZETIA Take 10 mg by mouth daily.   Ginkgo 60 MG Tabs Take 120 mg by mouth daily.   hydrocortisone 25 MG suppository Commonly known as:  ANUSOL-HC Place 25 mg rectally 2 (two) times daily.   levothyroxine 75 MCG tablet Commonly known as:  SYNTHROID, LEVOTHROID Take 75 mcg by mouth daily before breakfast.   loratadine 10 MG tablet Commonly known as:  CLARITIN Take 10 mg by mouth daily.   losartan-hydrochlorothiazide 100-12.5 MG tablet Commonly known as:  HYZAAR Take 1 tablet by mouth daily.   meclizine 25 MG tablet Commonly known as:  ANTIVERT Take 25 mg by mouth every 8 (eight) hours as needed for dizziness.   mirtazapine 7.5 MG tablet Commonly known as:  REMERON Take 1 tablet by mouth at bedtime.   polyethylene glycol packet Commonly known as:  MIRALAX / GLYCOLAX Take 17 g by mouth daily.   sennosides-docusate sodium 8.6-50 MG  tablet Commonly known as:  SENOKOT-S Take 2 tablets by mouth daily.   sertraline 50 MG tablet Commonly known as:  ZOLOFT Take 50 mg by mouth daily.   traMADol 50 MG tablet Commonly known as:  ULTRAM Take 50 mg by mouth 3 (three) times daily.   vitamin B-12 1000 MCG tablet Commonly known as:  CYANOCOBALAMIN Take 1,000 mcg by mouth daily.   Vitamin D 2000 units Caps Take 4,000  Units by mouth daily.       Relevant Imaging Results:  Relevant Lab Results:   Additional Information    Judi Cong, LCSW

## 2017-06-28 NOTE — Discharge Summary (Signed)
[facility] -  at [facility]   PATIENT NAME: Amanda Mercado    MR#:  [id number]  DATE OF BIRTH:  1921/10/29  DATE OF ADMISSION:  06/27/2017 ADMITTING PHYSICIAN: Milagros Loll, MD  DATE OF DISCHARGE: 06/28/2017  PRIMARY CARE PHYSICIAN: System, Pcp Not In   ADMISSION DIAGNOSIS:  Dehydration [E86.0] Hypokalemia [E87.6] Altered mental status, unspecified altered mental status type [R41.82]  DISCHARGE DIAGNOSIS:  Active Problems:   AKI (acute kidney injury) (HCC)   SECONDARY DIAGNOSIS:   Past Medical History:  Diagnosis Date  . Arthritis    ra  . COPD (chronic obstructive pulmonary disease) (HCC)   . Diabetes mellitus without complication (HCC)   . Dizziness    occ  . GERD (gastroesophageal reflux disease)   . HOH (hard of hearing)   . Hypertension   . Hypothyroidism   . Psoriasis   . Rosacea   . Shortness of breath dyspnea      ADMITTING HISTORY   HISTORY OF PRESENT ILLNESS:  Amanda Mercado  is a 84 y.o. female with a known history of hypertension, dementia, COPD presents to the emergency room due to confusion.  Patient was found to be unresponsive at the nursing home.  Here patient is slowly improving.  She was recently seen in the hospital and found to have mild dehydration and was given IV fluids and discharged back home from the emergency room.  Today she has been found to have acute kidney injury, hypokalemia and is being admitted to the hospitalist service.  Patient has no respiratory symptoms.  Saturations are normal.  Right lower lobe atelectasis versus infiltrate found on chest x-ray.  Procalcitonin pending.  HOSPITAL COURSE:   * AKI due to dehydration. Decreased PO intake * Hypokalemia * Dementia * Severe protein calorie malnutrition * HTN  Patient admitted to medical floor. Telemetry monitoring for hypokalemia. Started on IV fluids. Creatinine close to her baseline. Hypokalemia has resolved. Chest x-ray raise concern for possible  infiltrate processes like basis. Patient did not have any respiratory symptoms. Pro calcitonin level checked and normal. Not pneumonia.  Patient has had progressively worsening dementia and decreased oral intake over the past few months. Discussed with daughter who is the healthcare power of attorney. We discussed regarding having palliative care follow and hospice set up. I also discussed with daughter regarding the plan if patient starts worsening or if her dehydration reoccurs. Daughter wants transition to comfort measures at that point instead of bringing her to the emergency room.  Guarded prognosis  CONSULTS OBTAINED:    DRUG ALLERGIES:   Allergies  Allergen Reactions  . Statins Other (See Comments)    Entire body aches, bones and muscles    DISCHARGE MEDICATIONS:   Allergies as of 06/28/2017      Reactions   Statins Other (See Comments)   Entire body aches, bones and muscles      Medication List    STOP taking these medications   torsemide 20 MG tablet Commonly known as:  DEMADEX     TAKE these medications   acetaminophen 500 MG tablet Commonly known as:  TYLENOL Take 500 mg by mouth every 4 (four) hours as needed for mild pain.   albuterol 108 (90 Base) MCG/ACT inhaler Commonly known as:  PROVENTIL HFA;VENTOLIN HFA Inhale 2 puffs into the lungs every 4 (four) hours as needed for wheezing or shortness of breath.   COMBIGAN 0.2-0.5 % ophthalmic solution Generic drug:  brimonidine-timolol Place 1 drop into both eyes every  12 (twelve) hours.   DRAMAMINE 50 MG Chew Generic drug:  dimenhyDRINATE Chew 1 tablet by mouth every 4 (four) hours as needed.   ezetimibe 10 MG tablet Commonly known as:  ZETIA Take 10 mg by mouth daily.   Ginkgo 60 MG Tabs Take 120 mg by mouth daily.   hydrocortisone 25 MG suppository Commonly known as:  ANUSOL-HC Place 25 mg rectally 2 (two) times daily.   levothyroxine 75 MCG tablet Commonly known as:  SYNTHROID, LEVOTHROID Take  75 mcg by mouth daily before breakfast.   loratadine 10 MG tablet Commonly known as:  CLARITIN Take 10 mg by mouth daily.   losartan-hydrochlorothiazide 100-12.5 MG tablet Commonly known as:  HYZAAR Take 1 tablet by mouth daily.   meclizine 25 MG tablet Commonly known as:  ANTIVERT Take 25 mg by mouth every 8 (eight) hours as needed for dizziness.   mirtazapine 7.5 MG tablet Commonly known as:  REMERON Take 1 tablet by mouth at bedtime.   polyethylene glycol packet Commonly known as:  MIRALAX / GLYCOLAX Take 17 g by mouth daily.   sennosides-docusate sodium 8.6-50 MG tablet Commonly known as:  SENOKOT-S Take 2 tablets by mouth daily.   sertraline 50 MG tablet Commonly known as:  ZOLOFT Take 50 mg by mouth daily.   traMADol 50 MG tablet Commonly known as:  ULTRAM Take 50 mg by mouth 3 (three) times daily.   vitamin B-12 1000 MCG tablet Commonly known as:  CYANOCOBALAMIN Take 1,000 mcg by mouth daily.   Vitamin D 2000 units Caps Take 4,000 Units by mouth daily.       Today   VITAL SIGNS:  Blood pressure (!) 122/55, pulse 82, temperature 97.9 F (36.6 C), temperature source Oral, resp. rate 16, height 5' (1.524 m), weight 65.2 kg (143 lb 11.8 oz), SpO2 91 %.  I/O:    Intake/Output Summary (Last 24 hours) at 06/28/2017 1016 Last data filed at 06/28/2017 0747 Gross per 24 hour  Intake 448.67 ml  Output -  Net 448.67 ml    PHYSICAL EXAMINATION:  Physical Exam  GENERAL:  84 y.o.-year-old patient lying in the bed with no acute distress.  LUNGS: Normal breath sounds bilaterally, no wheezing, rales,rhonchi or crepitation. No use of accessory muscles of respiration.  CARDIOVASCULAR: S1, S2 normal. No murmurs, rubs, or gallops.  ABDOMEN: Soft, non-tender, non-distended. Bowel sounds present. No organomegaly or mass.  NEUROLOGIC: Moves all 4 extremities. PSYCHIATRIC: The patient is cofnused  DATA REVIEW:   CBC Recent Labs  Lab 06/27/17 1618  WBC 7.3  HGB  11.5*  HCT 34.4*  PLT 259    Chemistries  Recent Labs  Lab 06/28/17 0435  NA 138  K 3.7  CL 101  CO2 28  GLUCOSE 80  BUN 75*  CREATININE 1.68*  CALCIUM 8.9  AST 20  ALT 13*  ALKPHOS 50  BILITOT 0.8    Cardiac Enzymes Recent Labs  Lab 06/25/17 2117  TROPONINI <0.03    Microbiology Results  Results for orders placed or performed during the hospital encounter of 06/27/17  MRSA PCR Screening     Status: None   Collection Time: 06/28/17  1:42 AM  Result Value Ref Range Status   MRSA by PCR NEGATIVE NEGATIVE Final    Comment:        The GeneXpert MRSA Assay (FDA approved for NASAL specimens only), is one component of a comprehensive MRSA colonization surveillance program. It is not intended to diagnose MRSA infection nor to  guide or monitor treatment for MRSA infections. Performed at [facility], [address], [place], [place] [address]     RADIOLOGY:  Dg Chest Portable 1 View  Result Date: 06/27/2017 CLINICAL DATA:  Unresolved weakness for several days. Wheezing. Ex-smoker. EXAM: PORTABLE CHEST 1 VIEW COMPARISON:  06/25/2017. FINDINGS: Normal sized heart. Tortuous and calcified thoracic aorta. The right hemidiaphragm remains elevated. Mild increase in diffuse prominence of the interstitial markings. Mild linear and patchy density at the right lung base. Calcified granulomata at the left lung base. Moderate left and mild right shoulder degenerative changes. Thoracic spine degenerative changes and mild scoliosis. IMPRESSION: 1. Mild increase in prominence of the interstitial markings, compatible with interval mild interstitial pulmonary edema. 2. Mild right basilar atelectasis and possible pneumonia. Electronically Signed   By: Beckie Salts M.D.   On: 06/27/2017 16:20    Follow up with PCP in 1 week.  Management plans discussed with the patient, family and they are in agreement.  CODE STATUS:     Code Status Orders  (From admission, onward)         Start     Ordered   06/27/17 1758  Do not attempt resuscitation (DNR)  Continuous    Question Answer Comment  In the event of cardiac or respiratory ARREST Do not call a "code blue"   In the event of cardiac or respiratory ARREST Do not perform Intubation, CPR, defibrillation or ACLS   In the event of cardiac or respiratory ARREST Use medication by any route, position, wound care, and other measures to relive pain and suffering. May use oxygen, suction and manual treatment of airway obstruction as needed for comfort.      06/27/17 1758    Code Status History    This patient has a current code status but no historical code status.    Advance Directive Documentation     Most Recent Value  Type of Advance Directive  Living will  Pre-existing out of facility DNR order (yellow form or pink MOST form)  -  "MOST" Form in Place?  -      TOTAL TIME TAKING CARE OF THIS PATIENT ON DAY OF DISCHARGE: more than 30 minutes.   Orie Fisherman M.D on 06/28/2017 at 10:16 AM  Between 7am to 6pm - Pager - [phone]  After 6pm go to [email or web address] - password [email or web address]  [facility] [place] [facility]  Office  [phone]  CC: Primary care physician; System, Pcp Not In  Note: This dictation was prepared with Dragon dictation along with smaller phrase technology. Any transcriptional errors that result from this process are unintentional.

## 2017-06-28 NOTE — Progress Notes (Signed)
Per Social worker RN doesn't need to call report to [employer].

## 2017-06-28 NOTE — Clinical Social Work Note (Signed)
Clinical Social Work Assessment  Patient Details  Name: Amanda Mercado MRN: [id number] Date of Birth: 1921/04/24  Date of referral:  06/28/17               Reason for consult:  Facility Placement                Permission sought to share information with:  Chartered certified accountant granted to share information::  Yes, Verbal Permission Granted  Name::        Agency::  [facility]  Relationship::     Contact Information:     Housing/Transportation Living arrangements for the past 2 months:  [place] of Information:  Medical Team, Adult Children, Facility Patient Interpreter Needed:  None Criminal Activity/Legal Involvement Pertinent to Current Situation/Hospitalization:  No - Comment as needed Significant Relationships:  Adult Children, Community Support Lives with:  Facility Resident Do you feel safe going back to the place where you live?  Yes Need for family participation in patient care:  Yes (Comment)(Patient has dementia and is confused at baseline)  Care giving concerns:  Patient admitted from [facility]   Social Worker assessment / plan:  The CSW met with the patient and her daughter at bedside to discuss imminent discharge. The patient was lethargic; the patient's daughter confirmed that she will return to [facility] via non-emergent EMS. The CSW discussed palliative care and possible hospice to follow in the facility. The patient's daughter agreed to a referral to [facility].  The CSW confirmed with a med tech at [facility] that the patient can return today and palliative would be welcome. The patient's staff has been discussing the palliative needs with the patient's family and are glad that the family is in agreement with goals of care. The CSW has delivered the discharge packet and has sent the referral for palliative to visit the patient for an assessment in  the facility. The CSW is signing off. Please consult should needs arise.  Employment status:  Retired Nurse, adult PT Recommendations:  Not assessed at this time Information / Referral to community resources:  Other (Comment Required)([facility])  Patient/Family's Response to care:  The patient was lethargic. The patient's daughter thanked the CSW.  Patient/Family's Understanding of and Emotional Response to Diagnosis, Current Treatment, and Prognosis:  The patient's family understand that the patient's needs are changing and that she has a poor long term prognosis. The family is now ready to discuss changes to goals of care through palliative/hospice consultation. The patient's family is in agreement with the discharge plan.  Emotional Assessment Appearance:  Appears stated age Attitude/Demeanor/Rapport:  Lethargic Affect (typically observed):  Stable Orientation:  Oriented to Self Alcohol / Substance use:  Never Used Psych involvement (Current and /or in the community):  No (Comment)  Discharge Needs  Concerns to be addressed:  Care Coordination, Discharge Planning Concerns Readmission within the last 30 days:  No Current discharge risk:  Chronically ill, Physical Impairment Barriers to Discharge:  No Barriers Identified   Zettie Pho, LCSW 06/28/2017, 11:07 AM

## 2017-06-28 NOTE — Progress Notes (Signed)
Amanda Mercado  A and O x 1. VSS. Pt tolerating diet well. No complaints of pain or nausea. IV removed intact, no new prescriptions given. Pt's daughter voiced understanding of discharge instructions with no further questions. Pt discharged via EMS back to [facility].    Allergies as of 06/28/2017      Reactions   Statins Other (See Comments)   Entire body aches, bones and muscles      Medication List    STOP taking these medications   torsemide 20 MG tablet Commonly known as:  DEMADEX     TAKE these medications   acetaminophen 500 MG tablet Commonly known as:  TYLENOL Take 500 mg by mouth every 4 (four) hours as needed for mild pain.   albuterol 108 (90 Base) MCG/ACT inhaler Commonly known as:  PROVENTIL HFA;VENTOLIN HFA Inhale 2 puffs into the lungs every 4 (four) hours as needed for wheezing or shortness of breath.   COMBIGAN 0.2-0.5 % ophthalmic solution Generic drug:  brimonidine-timolol Place 1 drop into both eyes every 12 (twelve) hours.   DRAMAMINE 50 MG Chew Generic drug:  dimenhyDRINATE Chew 1 tablet by mouth every 4 (four) hours as needed.   ezetimibe 10 MG tablet Commonly known as:  ZETIA Take 10 mg by mouth daily.   Ginkgo 60 MG Tabs Take 120 mg by mouth daily.   hydrocortisone 25 MG suppository Commonly known as:  ANUSOL-HC Place 25 mg rectally 2 (two) times daily.   levothyroxine 75 MCG tablet Commonly known as:  SYNTHROID, LEVOTHROID Take 75 mcg by mouth daily before breakfast.   loratadine 10 MG tablet Commonly known as:  CLARITIN Take 10 mg by mouth daily.   losartan-hydrochlorothiazide 100-12.5 MG tablet Commonly known as:  HYZAAR Take 1 tablet by mouth daily.   meclizine 25 MG tablet Commonly known as:  ANTIVERT Take 25 mg by mouth every 8 (eight) hours as needed for dizziness.   mirtazapine 7.5 MG tablet Commonly known as:  REMERON Take 1 tablet by mouth at bedtime.   polyethylene glycol packet Commonly known as:  MIRALAX /  GLYCOLAX Take 17 g by mouth daily.   sennosides-docusate sodium 8.6-50 MG tablet Commonly known as:  SENOKOT-S Take 2 tablets by mouth daily.   sertraline 50 MG tablet Commonly known as:  ZOLOFT Take 50 mg by mouth daily.   traMADol 50 MG tablet Commonly known as:  ULTRAM Take 50 mg by mouth 3 (three) times daily.   vitamin B-12 1000 MCG tablet Commonly known as:  CYANOCOBALAMIN Take 1,000 mcg by mouth daily.   Vitamin D 2000 units Caps Take 4,000 Units by mouth daily.       Vitals:   06/28/17 0740 06/28/17 1249  BP:  (!) 107/49  Pulse:  74  Resp:  17  Temp:  98.1 F (36.7 C)  SpO2: 91% 94%    Amanda Mercado

## 2017-06-29 LAB — URINE CULTURE: Culture: NO GROWTH

## 2017-08-28 ENCOUNTER — Emergency Department
Admission: EM | Admit: 2017-08-28 | Discharge: 2017-08-28 | Disposition: A | Discharge status: Home / Self Care | Payer: Medicare Other | Attending: Emergency Medicine | Admitting: Emergency Medicine

## 2017-08-28 ENCOUNTER — Emergency Department: Payer: Medicare Other

## 2017-08-28 ENCOUNTER — Encounter: Payer: Self-pay | Admitting: Emergency Medicine

## 2017-08-28 DIAGNOSIS — N3001 Acute cystitis with hematuria: Secondary | ICD-10-CM | POA: Diagnosis not present

## 2017-08-28 DIAGNOSIS — J449 Chronic obstructive pulmonary disease, unspecified: Secondary | ICD-10-CM | POA: Diagnosis not present

## 2017-08-28 DIAGNOSIS — E039 Hypothyroidism, unspecified: Secondary | ICD-10-CM | POA: Diagnosis not present

## 2017-08-28 DIAGNOSIS — I10 Essential (primary) hypertension: Secondary | ICD-10-CM | POA: Insufficient documentation

## 2017-08-28 DIAGNOSIS — M25552 Pain in left hip: Secondary | ICD-10-CM | POA: Diagnosis not present

## 2017-08-28 DIAGNOSIS — R4182 Altered mental status, unspecified: Secondary | ICD-10-CM

## 2017-08-28 DIAGNOSIS — Z79899 Other long term (current) drug therapy: Secondary | ICD-10-CM | POA: Insufficient documentation

## 2017-08-28 DIAGNOSIS — E119 Type 2 diabetes mellitus without complications: Secondary | ICD-10-CM | POA: Insufficient documentation

## 2017-08-28 DIAGNOSIS — Z87891 Personal history of nicotine dependence: Secondary | ICD-10-CM | POA: Insufficient documentation

## 2017-08-28 DIAGNOSIS — G8929 Other chronic pain: Secondary | ICD-10-CM | POA: Diagnosis not present

## 2017-08-28 LAB — URINALYSIS, COMPLETE (UACMP) WITH MICROSCOPIC
BILIRUBIN URINE: NEGATIVE
Bacteria, UA: NONE SEEN
Glucose, UA: NEGATIVE mg/dL
Ketones, ur: NEGATIVE mg/dL
NITRITE: NEGATIVE
PH: 6 (ref 5.0–8.0)
Protein, ur: NEGATIVE mg/dL
SPECIFIC GRAVITY, URINE: 1.011 (ref 1.005–1.030)
WBC, UA: >50 WBC/hpf — ABNORMAL HIGH (ref 0–5)

## 2017-08-28 LAB — COMPREHENSIVE METABOLIC PANEL
ALT: 15 U/L (ref 0–44)
AST: 22 U/L (ref 15–41)
Albumin: 3.6 g/dL (ref 3.5–5.0)
Alkaline Phosphatase: 75 U/L (ref 38–126)
Anion gap: 8 (ref 5–15)
BUN: 18 mg/dL (ref 8–23)
CO2: 30 mmol/L (ref 22–32)
CREATININE: 0.92 mg/dL (ref 0.44–1.00)
Calcium: 9.3 mg/dL (ref 8.9–10.3)
Chloride: 101 mmol/L (ref 98–111)
GFR, EST NON AFRICAN AMERICAN: 52 mL/min — AB (ref >60)
Glucose, Bld: 127 mg/dL — ABNORMAL HIGH (ref 70–99)
Potassium: 3.2 mmol/L — ABNORMAL LOW (ref 3.5–5.1)
Sodium: 139 mmol/L (ref 135–145)
TOTAL PROTEIN: 7 g/dL (ref 6.5–8.1)
Total Bilirubin: 0.5 mg/dL (ref 0.3–1.2)

## 2017-08-28 LAB — CBC
HCT: 34.6 % — ABNORMAL LOW (ref 35.0–47.0)
Hemoglobin: 11.5 g/dL — ABNORMAL LOW (ref 12.0–16.0)
MCH: 29.8 pg (ref 26.0–34.0)
MCHC: 33.4 g/dL (ref 32.0–36.0)
MCV: 89.2 fL (ref 80.0–100.0)
PLATELETS: 260 10*3/uL (ref 150–440)
RBC: 3.88 MIL/uL (ref 3.80–5.20)
RDW: 13.7 % (ref 11.5–14.5)
WBC: 8.8 10*3/uL (ref 3.6–11.0)

## 2017-08-28 LAB — TROPONIN I

## 2017-08-28 MED ORDER — ACETAMINOPHEN 500 MG PO TABS
1000.0000 mg | ORAL_TABLET | Freq: Once | ORAL | Status: AC
  Administered 2017-08-28: 1000 mg via ORAL
  Filled 2017-08-28: qty 2

## 2017-08-28 MED ORDER — CEPHALEXIN 500 MG PO CAPS
500.0000 mg | ORAL_CAPSULE | Freq: Once | ORAL | Status: AC
  Administered 2017-08-28: 500 mg via ORAL
  Filled 2017-08-28: qty 1

## 2017-08-28 MED ORDER — CEPHALEXIN 500 MG PO CAPS: 500.0000 mg | ORAL_CAPSULE | Freq: Three times a day (TID) | ORAL | 0 refills | Status: AC

## 2017-08-28 NOTE — Discharge Instructions (Signed)
Review of patient's MAR shows that she has been receiving losartan/HCTZ twice every morning.  Patient should be receiving just 1 a day.  Please make sure to cancel the 100/12.5 mg 1 and continue to give her the 100/25 mg every morning.  She is being discharged with Keflex for UTI.  Return to the emergency room for worsening confusion, fever, vomiting, abdominal pain.  Otherwise follow-up with primary care doctor in 2 days.

## 2017-08-28 NOTE — ED Notes (Signed)
ED Provider at bedside. 

## 2017-08-28 NOTE — ED Triage Notes (Addendum)
Pt arrived via ems from [facility] with concerns over altered mental status. EMS report today staff went to assess pt this am and she was lethargic. EMS report from facility pt's baseline is normally alert and oriented. Pt arrives alerted, moaning responses. Pt is able to follow the command to squeeze hand.

## 2017-08-28 NOTE — ED Provider Notes (Signed)
[facility] Emergency Department Provider Note  ____________________________________________  Time seen: Approximately 11:04 AM  I have reviewed the triage vital signs and the nursing notes.   HISTORY  Chief Complaint Altered Mental Status  Level 5 caveat:  Portions of the history and physical were unable to be obtained due to dementia and confusion   HPI Amanda Mercado is a 84 y.o. female with history of hypertension, hypothyroidism, diabetes, COPD, arthritis, chronic back pain who presents from her nursing home for altered mental status.  According to EMS staff at the facility reports that they went to assess patient this morning and she was not responding as normal.  She is usually able to hold a conversation and is normally alert and oriented.  She was well yesterday evening.  At this time patient's only complaint is left buttock pain which she tells me it is old.  Patient is confused and disoriented.   According to patient's son who is at the bedside patient has had a chronic pains with her left hip and the pain that she is experiencing right now is not new.  He also believes the patient is currently at her baseline.  He last saw patient this past weekend.  Past Medical History:  Diagnosis Date  . Arthritis    ra  . COPD (chronic obstructive pulmonary disease) (HCC)   . Diabetes mellitus without complication (HCC)   . Dizziness    occ  . GERD (gastroesophageal reflux disease)   . HOH (hard of hearing)   . Hypertension   . Hypothyroidism   . Psoriasis   . Rosacea   . Shortness of breath dyspnea     Patient Active Problem List   Diagnosis Date Noted  . AKI (acute kidney injury) (HCC) 06/27/2017    Past Surgical History:  Procedure Laterality Date  . ABDOMINAL HYSTERECTOMY    . ANTERIOR VITRECTOMY Left 08/01/2014   Procedure: ANTERIOR VITRECTOMY;  Surgeon: Galen Manila, MD;  Location: [facility] ORS;  Service: Ophthalmology;  Laterality: Left;   . BACK SURGERY    . CATARACT EXTRACTION W/PHACO Right 07/18/2014   Procedure: CATARACT EXTRACTION PHACO AND INTRAOCULAR LENS PLACEMENT (IOC);  Surgeon: Galen Manila, MD;  Location: [facility] ORS;  Service: Ophthalmology;  Laterality: Right;  [place] 01:18 AP% 26.9 CDE 21.20 fluid pack lot #[id number]  . CATARACT EXTRACTION W/PHACO Left 08/01/2014   Procedure: CATARACT EXTRACTION PHACO AND INTRAOCULAR LENS PLACEMENT (IOC);  Surgeon: Galen Manila, MD;  Location: [facility] ORS;  Service: Ophthalmology;  Laterality: Left;  cassette lot # [id number]   [place] 1:35.6AP  25.3 CDE 24.7    Prior to Admission medications   Medication Sig Start Date End Date Taking? Authorizing Provider  acetaminophen (TYLENOL) 500 MG tablet Take 500 mg by mouth every 4 (four) hours as needed for mild pain.    Yes [provider]  albuterol (PROVENTIL HFA;VENTOLIN HFA) 108 (90 Base) MCG/ACT inhaler Inhale 2 puffs into the lungs every 4 (four) hours as needed for wheezing or shortness of breath.   Yes [provider]  brimonidine-timolol (COMBIGAN) 0.2-0.5 % ophthalmic solution Place 1 drop into the left eye 2 (two) times daily.    Yes [provider]  Cholecalciferol (VITAMIN D) 2000 units tablet Take 4,000 Units by mouth daily.   Yes [provider]  Cyanocobalamin (VITAMIN B-12) 1000 MCG SUBL Place 1,000 mcg under the tongue daily.   Yes [provider]  dimenhyDRINATE (DRAMAMINE) 50 MG CHEW Chew 1 tablet by mouth  every 4 (four) hours as needed (for dizziness).    Yes [provider]  ezetimibe (ZETIA) 10 MG tablet Take 10 mg by mouth daily.   Yes [provider]  Ginkgo Biloba 60 MG CAPS Take 120 mg by mouth daily.   Yes [provider]  hydrocortisone (ANUSOL-HC) 25 MG suppository Place 25 mg rectally 2 (two) times daily as needed for hemorrhoids.    Yes [provider]  levothyroxine (SYNTHROID, LEVOTHROID) 75 MCG tablet Take 75 mcg by mouth daily before  breakfast.   Yes [provider]  loratadine (CLARITIN) 10 MG tablet Take 10 mg by mouth daily.   Yes [provider]  losartan-hydrochlorothiazide (HYZAAR) 100-12.5 MG tablet Take 1 tablet by mouth daily.   Yes [provider]  losartan-hydrochlorothiazide (HYZAAR) 100-25 MG tablet Take 1 tablet by mouth daily.   Yes [provider]  magnesium oxide (MAG-OX) 400 MG tablet Take 400 mg by mouth daily.   Yes [provider]  meclizine (ANTIVERT) 25 MG tablet Take 25 mg by mouth every 8 (eight) hours as needed for dizziness.    Yes [provider]  mirtazapine (REMERON) 7.5 MG tablet Take 7.5 mg by mouth at bedtime.    Yes [provider]  polyethylene glycol (MIRALAX / GLYCOLAX) packet Take 17 g by mouth daily.    Yes [provider]  potassium chloride (MICRO-K) 10 MEQ CR capsule Take 10 mEq by mouth daily.   Yes [provider]  sennosides-docusate sodium (SENOKOT-S) 8.6-50 MG tablet Take 2 tablets by mouth daily.    Yes [provider]  sertraline (ZOLOFT) 50 MG tablet Take 50 mg by mouth daily.   Yes [provider]  traMADol (ULTRAM) 50 MG tablet Take 50 mg by mouth 3 (three) times daily.    Yes [provider]    Allergies Statins  No family history on file.  Social History Social History   Tobacco Use  . Smoking status: Former Games developer  . Smokeless tobacco: Never Used  Substance Use Topics  . Alcohol use: No  . Drug use: Not on file    Review of Systems  Constitutional: Negative for fever. + confusion Neck: No neck pain  Cardiovascular: Negative for chest pain. Respiratory: Negative for shortness of breath. Gastrointestinal: Negative for abdominal pain, vomiting or diarrhea. Genitourinary: Negative for dysuria. Musculoskeletal: + left hip pain Skin: Negative for rash. Neurological: Negative for headaches, weakness or numbness. Psych: No SI or  HI  ____________________________________________   PHYSICAL EXAM:  VITAL SIGNS: ED Triage Vitals [08/28/17 1003]  Enc Vitals Group     BP (!) 181/78     Pulse Rate 73     Resp 13     Temp (!) 97.4 F (36.3 C)     Temp Source Oral     SpO2 98 %     Weight (!) 362 lb 7 oz (164.4 kg)     Height 5\' 4"  (1.626 m)     Head Circumference      Peak Flow      Pain Score      Pain Loc      Pain Edu?      Excl. in GC?     Constitutional: Alert and oriented x 1, no apparent distress. HEENT:      Head: Normocephalic and atraumatic.         Eyes: Conjunctivae are normal. Sclera is non-icteric.       Mouth/Throat: Mucous membranes  are moist.       Neck: Supple with no signs of meningismus. Cardiovascular: Regular rate and rhythm. No murmurs, gallops, or rubs. 2+ symmetrical distal pulses are present in all extremities. No JVD. Respiratory: Normal respiratory effort. Lungs are clear to auscultation bilaterally. No wheezes, crackles, or rhonchi.  Gastrointestinal: Soft, non tender, and non distended with positive bowel sounds. No rebound or guarding. Musculoskeletal: Tender to palpation over the L sciatic notch with palpation. Nontender with normal range of motion in all extremities. No edema, cyanosis, or erythema of extremities. Neurologic: Normal speech and language. Face is symmetric. Moving all extremities. No gross focal neurologic deficits are appreciated. Skin: Skin is warm, dry and intact. No rash noted. Psychiatric: Mood and affect are normal. Speech and behavior are normal.  ____________________________________________   LABS (all labs ordered are listed, but only abnormal results are displayed)  Labs Reviewed  COMPREHENSIVE METABOLIC PANEL - Abnormal; Notable for the following components:      Result Value   Potassium 3.2 (*)    Glucose, Bld 127 (*)    GFR calc non Af Amer 52 (*)    All other components within normal limits  CBC - Abnormal; Notable for the following  components:   Hemoglobin 11.5 (*)    HCT 34.6 (*)    All other components within normal limits  URINALYSIS, COMPLETE (UACMP) WITH MICROSCOPIC - Abnormal; Notable for the following components:   Color, Urine YELLOW (*)    APPearance CLOUDY (*)    Hgb urine dipstick SMALL (*)    Leukocytes, UA LARGE (*)    WBC, UA >50 (*)    Non Squamous Epithelial PRESENT (*)    All other components within normal limits  URINE CULTURE  TROPONIN I  CBG MONITORING, ED   ____________________________________________  EKG  ED ECG REPORT I, Nita Sickle, the attending physician, personally viewed and interpreted this ECG.  Normal sinus rhythm, rate of 71, normal intervals, left axis deviation, no ST elevations or depressions.  Unchanged from prior ____________________________________________  RADIOLOGY  I have personally reviewed the images performed during this visit and I agree with the Radiologist's read.   Interpretation by Radiologist:  Dg Hip Unilat W Or Wo Pelvis 2-3 Views Left  Result Date: 08/28/2017 CLINICAL DATA:  Left hip pain.  No known injury. EXAM: DG HIP (WITH OR WITHOUT PELVIS) 2-3V LEFT COMPARISON:  02/24/2017. FINDINGS: Degenerative changes lumbar spine and both hips. Prior lower lumbar laminectomy. Stable deformity and subluxation of the left hip. No evidence of dislocation. No evidence of fracture. IMPRESSION: Severe osteopenia degenerative change. Stable deformity and subluxation of the left hip. Again a component mild left hip dysplasia may be present. No evidence of dislocation. No acute bony abnormality. Exam stable from 02/24/2017. Electronically Signed   By: Maisie Fus  Register   On: 08/28/2017 11:11     ____________________________________________   PROCEDURES  Procedure(s) performed: None Procedures Critical Care performed:  None ____________________________________________   INITIAL IMPRESSION / ASSESSMENT AND PLAN / ED COURSE  84 y.o. female with history of  hypertension, hypothyroidism, diabetes, COPD, arthritis, chronic back pain who presents from her nursing home for altered mental status.  Patient is alert and oriented to self only, she is neurologically intact.  According to son was at the bedside she is currently at her normal mental status.  Her only complaint is left buttock pain which is chronic for her.  Patient is tender to palpation over the left sciatic notch.  I will order an  x-ray of the hip to assess for any acute injuries.  Labs will be done to rule out UTI, anemia, dehydration, electrolyte abnormalities as possible causes of patient's early episode of confusion.  Her vitals show elevated blood pressure but no other acute findings    _________________________ 11:48 AM on 08/28/2017 -----------------------------------------  Patient remains at baseline according to her son.  UA is positive for UTI.  Labs showing no other acute findings.  Review of MAR from nursing home shows the patient has a duplicate medication of losartan hydrochlorothiazide and she keeps receiving it double dose every morning.  I spoke with the facility to make sure that one of the two ordered is discontinued.  Discussed findings with patient's son as well.  At this time she will be discharged back to his facility.   As part of my medical decision making, I reviewed the following data within the electronic MEDICAL RECORD NUMBER History obtained from family, Nursing notes reviewed and incorporated, Labs reviewed , EKG interpreted , Old chart reviewed, Notes from prior ED visits and [place] Controlled Substance Database    Pertinent labs & imaging results that were available during my care of the patient were reviewed by me and considered in my medical decision making (see chart for details).    ____________________________________________   FINAL CLINICAL IMPRESSION(S) / ED DIAGNOSES  Final diagnoses:  Altered mental status, unspecified altered mental status type  Acute  cystitis with hematuria  Chronic left hip pain      NEW MEDICATIONS STARTED DURING THIS VISIT:  ED Discharge Orders    None       Note:  This document was prepared using Dragon voice recognition software and may include unintentional dictation errors.    Don Perking, [place], MD 08/28/17 1153

## 2017-08-28 NOTE — ED Notes (Signed)
Son in room

## 2017-08-28 NOTE — ED Notes (Signed)
[facility]  CALLED  FOR  TRANSPORT 

## 2017-08-28 NOTE — ED Notes (Signed)
Son reports pt is more confused than her baseline. However, son also states she has days "like this." Son states pt has recurrent hip pain.

## 2017-08-28 NOTE — ED Notes (Signed)
Upon further assessment pt becomes alert and very agitated. Pt makes appropriate very communication. Pt able to follow more commands

## 2017-08-28 NOTE — ED Notes (Signed)
Patient returned from X-ray 

## 2017-08-28 NOTE — ED Notes (Signed)
Patient at xray

## 2017-08-30 LAB — URINE CULTURE: Culture: NO GROWTH

## 2017-09-08 ENCOUNTER — Other Ambulatory Visit: Payer: Self-pay

## 2017-09-08 ENCOUNTER — Inpatient Hospital Stay
Admission: EM | Admit: 2017-09-08 | Discharge: 2017-09-11 | DRG: 871 | Disposition: A | Discharge status: Skilled Nursing Facility | Payer: Medicare Other | Attending: Internal Medicine | Admitting: Internal Medicine

## 2017-09-08 ENCOUNTER — Emergency Department: Payer: Medicare Other

## 2017-09-08 DIAGNOSIS — Z7951 Long term (current) use of inhaled steroids: Secondary | ICD-10-CM

## 2017-09-08 DIAGNOSIS — Z888 Allergy status to other drugs, medicaments and biological substances status: Secondary | ICD-10-CM

## 2017-09-08 DIAGNOSIS — Z66 Do not resuscitate: Secondary | ICD-10-CM | POA: Diagnosis present

## 2017-09-08 DIAGNOSIS — J9601 Acute respiratory failure with hypoxia: Secondary | ICD-10-CM | POA: Diagnosis present

## 2017-09-08 DIAGNOSIS — Z882 Allergy status to sulfonamides status: Secondary | ICD-10-CM | POA: Diagnosis not present

## 2017-09-08 DIAGNOSIS — E1165 Type 2 diabetes mellitus with hyperglycemia: Secondary | ICD-10-CM | POA: Diagnosis present

## 2017-09-08 DIAGNOSIS — R0602 Shortness of breath: Secondary | ICD-10-CM

## 2017-09-08 DIAGNOSIS — Z87891 Personal history of nicotine dependence: Secondary | ICD-10-CM | POA: Diagnosis not present

## 2017-09-08 DIAGNOSIS — F0390 Unspecified dementia without behavioral disturbance: Secondary | ICD-10-CM | POA: Diagnosis present

## 2017-09-08 DIAGNOSIS — J690 Pneumonitis due to inhalation of food and vomit: Secondary | ICD-10-CM | POA: Diagnosis not present

## 2017-09-08 DIAGNOSIS — Z79899 Other long term (current) drug therapy: Secondary | ICD-10-CM

## 2017-09-08 DIAGNOSIS — I10 Essential (primary) hypertension: Secondary | ICD-10-CM | POA: Diagnosis present

## 2017-09-08 DIAGNOSIS — K219 Gastro-esophageal reflux disease without esophagitis: Secondary | ICD-10-CM | POA: Diagnosis present

## 2017-09-08 DIAGNOSIS — R319 Hematuria, unspecified: Secondary | ICD-10-CM | POA: Diagnosis present

## 2017-09-08 DIAGNOSIS — R339 Retention of urine, unspecified: Secondary | ICD-10-CM | POA: Diagnosis present

## 2017-09-08 DIAGNOSIS — D649 Anemia, unspecified: Secondary | ICD-10-CM | POA: Diagnosis present

## 2017-09-08 DIAGNOSIS — J449 Chronic obstructive pulmonary disease, unspecified: Secondary | ICD-10-CM | POA: Diagnosis present

## 2017-09-08 DIAGNOSIS — Z7189 Other specified counseling: Secondary | ICD-10-CM

## 2017-09-08 DIAGNOSIS — A419 Sepsis, unspecified organism: Principal | ICD-10-CM | POA: Diagnosis present

## 2017-09-08 DIAGNOSIS — Z881 Allergy status to other antibiotic agents status: Secondary | ICD-10-CM | POA: Diagnosis not present

## 2017-09-08 DIAGNOSIS — Z515 Encounter for palliative care: Secondary | ICD-10-CM | POA: Diagnosis present

## 2017-09-08 DIAGNOSIS — H9190 Unspecified hearing loss, unspecified ear: Secondary | ICD-10-CM | POA: Diagnosis present

## 2017-09-08 DIAGNOSIS — J189 Pneumonia, unspecified organism: Secondary | ICD-10-CM

## 2017-09-08 DIAGNOSIS — E039 Hypothyroidism, unspecified: Secondary | ICD-10-CM | POA: Diagnosis present

## 2017-09-08 DIAGNOSIS — E876 Hypokalemia: Secondary | ICD-10-CM | POA: Diagnosis present

## 2017-09-08 LAB — HEPATIC FUNCTION PANEL
ALBUMIN: 3.2 g/dL — AB (ref 3.5–5.0)
ALT: 12 U/L (ref 0–44)
AST: 18 U/L (ref 15–41)
Alkaline Phosphatase: 73 U/L (ref 38–126)
BILIRUBIN INDIRECT: 0.6 mg/dL (ref 0.3–0.9)
Bilirubin, Direct: 0.2 mg/dL (ref 0.0–0.2)
Total Bilirubin: 0.8 mg/dL (ref 0.3–1.2)
Total Protein: 6.9 g/dL (ref 6.5–8.1)

## 2017-09-08 LAB — LACTIC ACID, PLASMA: Lactic Acid, Venous: 1.1 mmol/L (ref 0.5–1.9)

## 2017-09-08 LAB — URINALYSIS, COMPLETE (UACMP) WITH MICROSCOPIC
Bilirubin Urine: NEGATIVE
GLUCOSE, UA: NEGATIVE mg/dL
Hgb urine dipstick: NEGATIVE
Ketones, ur: NEGATIVE mg/dL
Nitrite: NEGATIVE
PROTEIN: NEGATIVE mg/dL
Specific Gravity, Urine: 1.012 (ref 1.005–1.030)
pH: 6 (ref 5.0–8.0)

## 2017-09-08 LAB — BASIC METABOLIC PANEL
ANION GAP: 6 (ref 5–15)
BUN: 16 mg/dL (ref 8–23)
CALCIUM: 8.8 mg/dL — AB (ref 8.9–10.3)
CO2: 32 mmol/L (ref 22–32)
Chloride: 101 mmol/L (ref 98–111)
Creatinine, Ser: 0.83 mg/dL (ref 0.44–1.00)
GFR calc Af Amer: >60 mL/min (ref >60)
GFR, EST NON AFRICAN AMERICAN: 59 mL/min — AB (ref >60)
Glucose, Bld: 111 mg/dL — ABNORMAL HIGH (ref 70–99)
Potassium: 3 mmol/L — ABNORMAL LOW (ref 3.5–5.1)
Sodium: 139 mmol/L (ref 135–145)

## 2017-09-08 LAB — BLOOD GAS, VENOUS
ACID-BASE EXCESS: 8 mmol/L — AB (ref 0.0–2.0)
Bicarbonate: 33.3 mmol/L — ABNORMAL HIGH (ref 20.0–28.0)
O2 Saturation: 78.4 %
Patient temperature: 37
pCO2, Ven: 49 mmHg (ref 44.0–60.0)
pH, Ven: 7.44 — ABNORMAL HIGH (ref 7.250–7.430)
pO2, Ven: 41 mmHg (ref 32.0–45.0)

## 2017-09-08 LAB — GLUCOSE, CAPILLARY
GLUCOSE-CAPILLARY: 132 mg/dL — AB (ref 70–99)
Glucose-Capillary: 70 mg/dL (ref 70–99)
Glucose-Capillary: 127 mg/dL — ABNORMAL HIGH (ref 70–99)

## 2017-09-08 LAB — TROPONIN I: Troponin I: 0.03 ng/mL (ref <0.03)

## 2017-09-08 LAB — PHOSPHORUS: Phosphorus: 2.3 mg/dL — ABNORMAL LOW (ref 2.5–4.6)

## 2017-09-08 LAB — CBC WITH DIFFERENTIAL/PLATELET
BASOS ABS: 0 10*3/uL (ref 0–0.1)
BASOS PCT: 0 %
EOS PCT: 1 %
Eosinophils Absolute: 0.1 10*3/uL (ref 0–0.7)
HEMATOCRIT: 28.4 % — AB (ref 35.0–47.0)
Hemoglobin: 9.7 g/dL — ABNORMAL LOW (ref 12.0–16.0)
Lymphocytes Relative: 16 %
Lymphs Abs: 2.2 10*3/uL (ref 1.0–3.6)
MCH: 29.8 pg (ref 26.0–34.0)
MCHC: 34.2 g/dL (ref 32.0–36.0)
MCV: 87 fL (ref 80.0–100.0)
MONO ABS: 1.2 10*3/uL — AB (ref 0.2–0.9)
MONOS PCT: 9 %
NEUTROS ABS: 10.5 10*3/uL — AB (ref 1.4–6.5)
Neutrophils Relative %: 74 %
PLATELETS: 267 10*3/uL (ref 150–440)
RBC: 3.26 MIL/uL — ABNORMAL LOW (ref 3.80–5.20)
RDW: 13.4 % (ref 11.5–14.5)
WBC: 14 10*3/uL — ABNORMAL HIGH (ref 3.6–11.0)

## 2017-09-08 LAB — PROCALCITONIN: PROCALCITONIN: 0.1 ng/mL

## 2017-09-08 LAB — MRSA PCR SCREENING: MRSA BY PCR: NEGATIVE

## 2017-09-08 LAB — MAGNESIUM: Magnesium: 1.8 mg/dL (ref 1.7–2.4)

## 2017-09-08 LAB — BRAIN NATRIURETIC PEPTIDE: B NATRIURETIC PEPTIDE 5: 314 pg/mL — AB (ref 0.0–100.0)

## 2017-09-08 MED ORDER — INSULIN ASPART 100 UNIT/ML ~~LOC~~ SOLN: 0.0000 [IU] | Freq: Every day | SUBCUTANEOUS | Status: DC

## 2017-09-08 MED ORDER — ORAL CARE MOUTH RINSE
15.0000 mL | Freq: Two times a day (BID) | OROMUCOSAL | Status: DC
  Administered 2017-09-08 – 2017-09-11 (×5): 15 mL via OROMUCOSAL

## 2017-09-08 MED ORDER — POTASSIUM CHLORIDE 2 MEQ/ML IV SOLN
INTRAVENOUS | Status: DC
  Filled 2017-09-08 (×8): qty 1000

## 2017-09-08 MED ORDER — LORATADINE 10 MG PO TABS
10.0000 mg | ORAL_TABLET | Freq: Every day | ORAL | Status: DC
  Administered 2017-09-09 – 2017-09-11 (×3): 10 mg via ORAL
  Filled 2017-09-08 (×3): qty 1

## 2017-09-08 MED ORDER — PIPERACILLIN-TAZOBACTAM 3.375 G IVPB 30 MIN
3.3750 g | Freq: Once | INTRAVENOUS | Status: AC
  Administered 2017-09-08: 3.375 g via INTRAVENOUS
  Filled 2017-09-08: qty 50

## 2017-09-08 MED ORDER — KCL-LACTATED RINGERS 20 MEQ/L IV SOLN
INTRAVENOUS | Status: DC
  Filled 2017-09-08 (×2): qty 1000

## 2017-09-08 MED ORDER — INSULIN ASPART 100 UNIT/ML ~~LOC~~ SOLN
0.0000 [IU] | Freq: Three times a day (TID) | SUBCUTANEOUS | Status: DC
  Administered 2017-09-08 – 2017-09-10 (×3): 2 [IU] via SUBCUTANEOUS
  Administered 2017-09-11: 3 [IU] via SUBCUTANEOUS
  Administered 2017-09-11: 2 [IU] via SUBCUTANEOUS
  Filled 2017-09-08 (×5): qty 1

## 2017-09-08 MED ORDER — VANCOMYCIN HCL IN DEXTROSE 1-5 GM/200ML-% IV SOLN
1000.0000 mg | INTRAVENOUS | Status: DC
  Filled 2017-09-08: qty 200

## 2017-09-08 MED ORDER — ENOXAPARIN SODIUM 40 MG/0.4ML ~~LOC~~ SOLN
40.0000 mg | Freq: Every day | SUBCUTANEOUS | Status: DC
  Administered 2017-09-08: 40 mg via SUBCUTANEOUS
  Filled 2017-09-08: qty 0.4

## 2017-09-08 MED ORDER — LOSARTAN POTASSIUM-HCTZ 100-12.5 MG PO TABS: 1.0000 | ORAL_TABLET | Freq: Every day | ORAL | Status: DC

## 2017-09-08 MED ORDER — POTASSIUM CHLORIDE IN NACL 40-0.9 MEQ/L-% IV SOLN
INTRAVENOUS | Status: AC
  Administered 2017-09-08: 50 mL/h via INTRAVENOUS
  Filled 2017-09-08: qty 1000

## 2017-09-08 MED ORDER — LOSARTAN POTASSIUM-HCTZ 100-25 MG PO TABS: 1.0000 | ORAL_TABLET | Freq: Every day | ORAL | Status: DC

## 2017-09-08 MED ORDER — POLYETHYLENE GLYCOL 3350 17 G PO PACK
17.0000 g | PACK | Freq: Every day | ORAL | Status: DC
  Administered 2017-09-10 – 2017-09-11 (×2): 17 g via ORAL
  Filled 2017-09-08 (×3): qty 1

## 2017-09-08 MED ORDER — ENSURE ENLIVE PO LIQD
237.0000 mL | Freq: Two times a day (BID) | ORAL | Status: DC
  Administered 2017-09-08 – 2017-09-11 (×6): 237 mL via ORAL

## 2017-09-08 MED ORDER — LOSARTAN POTASSIUM 50 MG PO TABS
100.0000 mg | ORAL_TABLET | Freq: Every day | ORAL | Status: DC
  Administered 2017-09-09 – 2017-09-11 (×3): 100 mg via ORAL
  Filled 2017-09-08 (×3): qty 2

## 2017-09-08 MED ORDER — LEVOTHYROXINE SODIUM 50 MCG PO TABS
75.0000 ug | ORAL_TABLET | Freq: Every day | ORAL | Status: DC
  Administered 2017-09-09 – 2017-09-11 (×3): 75 ug via ORAL
  Filled 2017-09-08 (×3): qty 1

## 2017-09-08 MED ORDER — FONDAPARINUX SODIUM 2.5 MG/0.5ML ~~LOC~~ SOLN
2.5000 mg | Freq: Every day | SUBCUTANEOUS | Status: DC
  Filled 2017-09-08: qty 0.5

## 2017-09-08 MED ORDER — VITAMIN B-12 1000 MCG PO TABS
1000.0000 ug | ORAL_TABLET | Freq: Every day | ORAL | Status: DC
  Administered 2017-09-10 – 2017-09-11 (×2): 1000 ug via ORAL
  Filled 2017-09-08 (×3): qty 1

## 2017-09-08 MED ORDER — SERTRALINE HCL 50 MG PO TABS
50.0000 mg | ORAL_TABLET | Freq: Every day | ORAL | Status: DC
  Administered 2017-09-09 – 2017-09-11 (×3): 50 mg via ORAL
  Filled 2017-09-08 (×3): qty 1

## 2017-09-08 MED ORDER — HYDROCORTISONE ACETATE 25 MG RE SUPP
25.0000 mg | Freq: Two times a day (BID) | RECTAL | Status: DC | PRN
  Filled 2017-09-08: qty 1

## 2017-09-08 MED ORDER — TIMOLOL MALEATE 0.5 % OP SOLN
1.0000 [drp] | Freq: Two times a day (BID) | OPHTHALMIC | Status: DC
  Administered 2017-09-08 – 2017-09-11 (×7): 1 [drp] via OPHTHALMIC
  Filled 2017-09-08: qty 5

## 2017-09-08 MED ORDER — MIRTAZAPINE 15 MG PO TABS
7.5000 mg | ORAL_TABLET | Freq: Every day | ORAL | Status: DC
  Administered 2017-09-08 – 2017-09-10 (×3): 7.5 mg via ORAL
  Filled 2017-09-08 (×3): qty 1

## 2017-09-08 MED ORDER — SODIUM CHLORIDE 0.9 % IV SOLN: 2.0000 g | Freq: Once | INTRAVENOUS | Status: DC

## 2017-09-08 MED ORDER — HYDROCHLOROTHIAZIDE 25 MG PO TABS
25.0000 mg | ORAL_TABLET | Freq: Every day | ORAL | Status: DC
  Administered 2017-09-09 – 2017-09-11 (×3): 25 mg via ORAL
  Filled 2017-09-08 (×3): qty 1

## 2017-09-08 MED ORDER — POTASSIUM PHOSPHATES 15 MMOLE/5ML IV SOLN
10.0000 mmol | Freq: Once | INTRAVENOUS | Status: AC
  Administered 2017-09-08: 10 mmol via INTRAVENOUS
  Filled 2017-09-08: qty 3.33

## 2017-09-08 MED ORDER — BRIMONIDINE TARTRATE-TIMOLOL 0.2-0.5 % OP SOLN: 1.0000 [drp] | Freq: Two times a day (BID) | OPHTHALMIC | Status: DC

## 2017-09-08 MED ORDER — ADULT MULTIVITAMIN W/MINERALS CH
1.0000 | ORAL_TABLET | Freq: Every day | ORAL | Status: DC
  Administered 2017-09-08 – 2017-09-11 (×4): 1 via ORAL
  Filled 2017-09-08 (×4): qty 1

## 2017-09-08 MED ORDER — POTASSIUM CHLORIDE 10 MEQ/100ML IV SOLN
10.0000 meq | INTRAVENOUS | Status: AC
  Administered 2017-09-08 (×2): 10 meq via INTRAVENOUS
  Filled 2017-09-08 (×2): qty 100

## 2017-09-08 MED ORDER — VITAMIN D3 25 MCG (1000 UNIT) PO TABS
4000.0000 [IU] | ORAL_TABLET | Freq: Every day | ORAL | Status: DC
  Administered 2017-09-09 – 2017-09-11 (×3): 4000 [IU] via ORAL
  Filled 2017-09-08 (×4): qty 4

## 2017-09-08 MED ORDER — BRIMONIDINE TARTRATE 0.2 % OP SOLN
1.0000 [drp] | Freq: Two times a day (BID) | OPHTHALMIC | Status: DC
  Administered 2017-09-08 – 2017-09-11 (×7): 1 [drp] via OPHTHALMIC
  Filled 2017-09-08: qty 5

## 2017-09-08 MED ORDER — PIPERACILLIN-TAZOBACTAM 3.375 G IVPB
3.3750 g | Freq: Three times a day (TID) | INTRAVENOUS | Status: DC
  Administered 2017-09-08 – 2017-09-11 (×10): 3.375 g via INTRAVENOUS
  Filled 2017-09-08 (×10): qty 50

## 2017-09-08 MED ORDER — IPRATROPIUM-ALBUTEROL 0.5-2.5 (3) MG/3ML IN SOLN
3.0000 mL | RESPIRATORY_TRACT | Status: DC
  Administered 2017-09-08 – 2017-09-09 (×8): 3 mL via RESPIRATORY_TRACT
  Filled 2017-09-08 (×9): qty 3

## 2017-09-08 MED ORDER — VANCOMYCIN HCL IN DEXTROSE 1-5 GM/200ML-% IV SOLN
1000.0000 mg | Freq: Once | INTRAVENOUS | Status: AC
  Administered 2017-09-08: 1000 mg via INTRAVENOUS
  Filled 2017-09-08: qty 200

## 2017-09-08 NOTE — ED Triage Notes (Signed)
Pt via ems from [facility] with breathing difficulty. Pt takes nebulizer twice a day and has a cough. 2L applied, pt does not wear oxygen at facility. Hx of reactive airway disease. EMS reports pt is A&O. Pt NAD, respirations even and non labored.

## 2017-09-08 NOTE — ED Notes (Signed)
Family at bedside. 

## 2017-09-08 NOTE — Progress Notes (Signed)
Initial Nutrition Assessment  DOCUMENTATION CODES:   Not applicable  INTERVENTION:   Ensure Enlive po BID, each supplement provides 350 kcal and 20 grams of protein  MVI daily   Magic cup TID with meals, each supplement provides 290 kcal and 9 grams of protein  NUTRITION DIAGNOSIS:   Increased nutrient needs related to acute illness(PNA h/o COPD) as evidenced by increased estimated needs.  GOAL:   Patient will meet greater than or equal to 90% of their needs  MONITOR:   PO intake, Supplement acceptance, Labs, Weight trends, Skin, I & O's  REASON FOR ASSESSMENT:   Malnutrition Screening Tool    ASSESSMENT:   84 y.o. female with a known history of T2NIDDM, HTN, hypothyroidism, COPD, GERD, dementia (AAOx2, person + place) who presents from long-term care facility [facility]) w/ cough/SOB and hypoxemic respiratory failure.   Visited pt's room today, Unable to obtain nutrition related history as pt with dementia. Per chart, it does not appear that pt has had any significant wt loss; however, chart does not provide a very detailed weight history. Pt seen by SLP today and initiated on dysphagia 3/ thin liquid diet. RD will order supplements and vitamins to help pt meet her estimated needs. Pt requests chocolate flavors.   Medications reviewed and include: vitamin D, lovenox, insulin, synthroid, mirtazapine, miralax, B12, zosyn, KCl, KPhos, vancomycin   Labs reviewed: K 3.0(L), P 2.3(L), Mg 1.8 wnl Wbc- 14.0(H), Hgb 9.7(L), Hct 28.4(L)  NUTRITION - FOCUSED PHYSICAL EXAM:    Most Recent Value  Orbital Region  No depletion  Upper Arm Region  No depletion  Thoracic and Lumbar Region  No depletion  Buccal Region  No depletion  Temple Region  Moderate depletion  Clavicle Bone Region  Moderate depletion  Clavicle and Acromion Bone Region  Moderate depletion  Scapular Bone Region  No depletion  Dorsal Hand  No depletion  Patellar Region  No depletion  Anterior Thigh Region   No depletion  Posterior Calf Region  No depletion  Edema (RD Assessment)  None  Hair  Reviewed  Eyes  Reviewed  Mouth  Reviewed  Skin  Reviewed  Nails  Reviewed     Diet Order:   Diet Order            DIET DYS 3 Room service appropriate? Yes with Assist; Fluid consistency: Thin  Diet effective now             EDUCATION NEEDS:   Not appropriate for education at this time  Skin:  Skin Assessment: Reviewed RN Assessment  Last BM:  pta  Height:   Ht Readings from Last 1 Encounters:  09/08/17 5\' 4"  (1.626 m)    Weight:   Wt Readings from Last 1 Encounters:  09/08/17 69.9 kg    Ideal Body Weight:  54.5 kg  BMI:  Body mass index is 26.43 kg/m.  Estimated Nutritional Needs:   Kcal:  1400-1600kcal/day   Protein:  70-77g/day   Fluid:  >1.4L/day   11/08/17 MS, RD, LDN Pager #- [phone] Office#- [phone] After Hours Pager: [phone]

## 2017-09-08 NOTE — Clinical Social Work Note (Signed)
Clinical Social Work Assessment  Patient Details  Name: Amanda Mercado MRN: [id number] Date of Birth: [birth year]  Date of referral:  09/08/17               Reason for consult:  Facility Placement                Permission sought to share information with:  Family Supports, Magazine features editor Permission granted to share information::  Yes, Verbal Permission Granted  Name::     Athene, [place] [phone]  [phone]   Agency::  ALF admissions  Relationship::     Contact Information:     Housing/Transportation Living arrangements for the past 2 months:  Assisted Living Facility([facility]) Source of Information:  Medical Team, Adult Children Patient Interpreter Needed:  None Criminal Activity/Legal Involvement Pertinent to Current Situation/Hospitalization:  No - Comment as needed Significant Relationships:  Adult Children Lives with:  Facility Resident Do you feel safe going back to the place where you live?  Yes Need for family participation in patient care:  Yes (Comment)  Care giving concerns:  Patient's family did not express any concerns about returning back to [facility].   Social Worker assessment / plan:  Patient is a 84 year old female with dementia, patient is a resident at [facility].  Due to patient's dementia, CSW completed assessment by speaking with patient's son, Christen Bame.  Patient's son was explained role of CSW and process for coordinating with Diamantina Monks to facilitate discharge planning.  Patient's son reports that patient has been at ALF for about 5 months, and they have been pleased with the care that is being provided.  Patient's son states they would like patient to return back to Memory Care ALF.  Patient's son did not express any other questions or concerns about returning to ALF.  CSW was given permission to send required clinical information to [facility].  Employment status:   Retired Database administrator PT Recommendations:  No Follow Up Information / Referral to community resources:     Patient/Family's Response to care:  Patient's family are in agreement to having patient return back to [facility] memory care ALF. Patient/Family's Understanding of and Emotional Response to Diagnosis, Current Treatment, and Prognosis:  Patient's family is hopeful that patient will be able to return back to ALF soon.  Emotional Assessment Appearance:  Appears stated age Attitude/Demeanor/Rapport:    Affect (typically observed):  Appropriate, Pleasant Orientation:  Oriented to Self Alcohol / Substance use:  Not Applicable Psych involvement (Current and /or in the community):  No (Comment)  Discharge Needs  Concerns to be addressed:  Cognitive Concerns, Care Coordination Readmission within the last 30 days:  No Current discharge risk:  Cognitively Impaired Barriers to Discharge:  Continued Medical Work up   [place] Constable 09/08/2017, 5:23 PM

## 2017-09-08 NOTE — ED Provider Notes (Addendum)
[facility] Emergency Department Provider Note  ____________________________________________   First MD Initiated Contact with Patient 09/08/17 0225     (approximate)  I have reviewed the triage vital signs and the nursing notes.   HISTORY  Chief Complaint Shortness of Breath  Level 5 caveat:  history/ROS limited by chronic dementia  HPI Amanda Mercado is a 84 y.o. female with extensive chronic medical history that does include COPD and dementia who presents for evaluation of shortness of breath.  History is very limited but family is at bedside.  They say that she has been congested recently and has been having a cough.  She has been getting breathing treatments at her facility but the patient will not use the breathing treatment unless somebody standing right next to her and encouraging her to continue doing so.  There is no report of fever but the patient is not able to provide any additional details.  She is also not able to explain whether or not she feels ill although she did seem to indicate she is not feeling as well as usual.  She denies chest pain.  No report of nausea or vomiting.  She reportedly has not been complaining of abdominal pain.  She does not use oxygen at baseline but according to EMS she was hypoxemic when they arrived and they put her on 2 L of oxygen.  Upon arrival to the emergency department while at rest she had an SPO2 of 90% on room air so she was placed on 2 L for comfort and is now in the mid 90s.  Past Medical History:  Diagnosis Date  . Arthritis    ra  . COPD (chronic obstructive pulmonary disease) (HCC)   . Diabetes mellitus without complication (HCC)   . Dizziness    occ  . GERD (gastroesophageal reflux disease)   . HOH (hard of hearing)   . Hypertension   . Hypothyroidism   . Psoriasis   . Rosacea   . Shortness of breath dyspnea     Patient Active Problem List   Diagnosis Date Noted  . Acute hypoxemic  respiratory failure (HCC) 09/08/2017  . AKI (acute kidney injury) (HCC) 06/27/2017    Past Surgical History:  Procedure Laterality Date  . ABDOMINAL HYSTERECTOMY    . ANTERIOR VITRECTOMY Left 08/01/2014   Procedure: ANTERIOR VITRECTOMY;  Surgeon: Galen Manila, MD;  Location: [facility] ORS;  Service: Ophthalmology;  Laterality: Left;  . BACK SURGERY    . CATARACT EXTRACTION W/PHACO Right 07/18/2014   Procedure: CATARACT EXTRACTION PHACO AND INTRAOCULAR LENS PLACEMENT (IOC);  Surgeon: Galen Manila, MD;  Location: [facility] ORS;  Service: Ophthalmology;  Laterality: Right;  [place] 01:18 AP% 26.9 CDE 21.20 fluid pack lot #[id number]  . CATARACT EXTRACTION W/PHACO Left 08/01/2014   Procedure: CATARACT EXTRACTION PHACO AND INTRAOCULAR LENS PLACEMENT (IOC);  Surgeon: Galen Manila, MD;  Location: [facility] ORS;  Service: Ophthalmology;  Laterality: Left;  cassette lot # [id number]   [place] 1:35.6AP  25.3 CDE 24.7    Prior to Admission medications   Medication Sig Start Date End Date Taking? Authorizing Provider  acetaminophen (TYLENOL) 500 MG tablet Take 500 mg by mouth every 4 (four) hours as needed for mild pain.    Yes [provider]  albuterol (PROVENTIL HFA;VENTOLIN HFA) 108 (90 Base) MCG/ACT inhaler Inhale 2 puffs into the lungs every 4 (four) hours as needed for wheezing or shortness of breath.   Yes [provider]  brimonidine-timolol (COMBIGAN) 0.2-0.5 %  ophthalmic solution Place 1 drop into the left eye 2 (two) times daily.    Yes [provider]  Cholecalciferol (VITAMIN D) 2000 units tablet Take 4,000 Units by mouth daily.   Yes [provider]  Cyanocobalamin (VITAMIN B-12) 1000 MCG SUBL Place 1,000 mcg under the tongue daily.   Yes [provider]  dimenhyDRINATE (DRAMAMINE) 50 MG CHEW Chew 1 tablet by mouth every 4 (four) hours as needed (for dizziness).    Yes [provider]  ezetimibe (ZETIA) 10 MG tablet Take 10 mg by mouth daily.   Yes  [provider]  Ginkgo Biloba 60 MG CAPS Take 120 mg by mouth daily.   Yes [provider]  hydrocortisone (ANUSOL-HC) 25 MG suppository Place 25 mg rectally 2 (two) times daily as needed for hemorrhoids.    Yes [provider]  levothyroxine (SYNTHROID, LEVOTHROID) 75 MCG tablet Take 75 mcg by mouth daily before breakfast.   Yes [provider]  loratadine (CLARITIN) 10 MG tablet Take 10 mg by mouth daily.   Yes [provider]  losartan-hydrochlorothiazide (HYZAAR) 100-12.5 MG tablet Take 1 tablet by mouth daily.   Yes [provider]  losartan-hydrochlorothiazide (HYZAAR) 100-25 MG tablet Take 1 tablet by mouth daily.   Yes [provider]  magnesium oxide (MAG-OX) 400 MG tablet Take 400 mg by mouth daily.   Yes [provider]  meclizine (ANTIVERT) 25 MG tablet Take 25 mg by mouth every 8 (eight) hours as needed for dizziness.    Yes [provider]  mirtazapine (REMERON) 7.5 MG tablet Take 7.5 mg by mouth at bedtime.    Yes [provider]  polyethylene glycol (MIRALAX / GLYCOLAX) packet Take 17 g by mouth daily.    Yes [provider]  potassium chloride (MICRO-K) 10 MEQ CR capsule Take 10 mEq by mouth daily.   Yes [provider]  sennosides-docusate sodium (SENOKOT-S) 8.6-50 MG tablet Take 2 tablets by mouth daily.    Yes [provider]  sertraline (ZOLOFT) 50 MG tablet Take 50 mg by mouth daily.   Yes [provider]  traMADol (ULTRAM) 50 MG tablet Take 50 mg by mouth 3 (three) times daily.    Yes [provider]    Allergies Doxycycline calcium; Raloxifene; Rofecoxib; Statins; and Sulfa antibiotics  History reviewed. No pertinent family history.  Social History Social History   Tobacco Use  . Smoking status: Former Games developer  . Smokeless tobacco: Never Used  Substance Use Topics  . Alcohol use: No  . Drug use: Not on file    Review of  Systems Level 5 caveat:  history/ROS limited by chronic dementia  Reported shortness of breath, no history of chest pain.  Recent upper respiratory symptoms.  ____________________________________________   PHYSICAL EXAM:  VITAL SIGNS: ED Triage Vitals  Enc Vitals Group     BP 09/08/17 0205 (!) 115/55     Pulse Rate 09/08/17 0205 82     Resp 09/08/17 0205 18     Temp 09/08/17 0205 98.3 F (36.8 C)     Temp Source 09/08/17 0205 Oral     SpO2 09/08/17 0205 91 %     Weight 09/08/17 0206 (!) 164.4 kg (362 lb 7 oz)     Height 09/08/17 0206 1.626 m (5\' 4" )     Head Circumference --      Peak Flow --      Pain Score 09/08/17 0206 0     Pain  Loc --      Pain Edu? --      Excl. in GC? --     Constitutional: Awake and alert, agitated but no respiratory distress at this time on 2 L of oxygen.  Unable to provide history. Eyes: Conjunctivae are normal.  Head: Atraumatic. Nose: No congestion/rhinnorhea. Mouth/Throat: Mucous membranes are moist. Neck: No stridor.  No meningeal signs.   Cardiovascular: Normal rate, regular rhythm. Good peripheral circulation. Grossly normal heart sounds.  Of note, the patient had a run of what appears to be A. fib with RVR while I was in the room which resolved spontaneously Respiratory: Normal respiratory effort.  No retractions.  Some mild expiratory wheezing, thick sounding cough Gastrointestinal: Soft and nontender. No distention.  Musculoskeletal: No lower extremity tenderness nor edema. No gross deformities of extremities. Neurologic: Patient is demented at baseline and is able to speak and answer questions but cannot provide any additional history.  She is moving all 4 extremities and has no evidence of any cranial nerve deficits at this time. Skin:  Skin is warm, dry and intact. No rash noted. Psychiatric: Mood and affect are agitated but generally appropriate under the circumstances.  ____________________________________________   LABS (all  labs ordered are listed, but only abnormal results are displayed)  Labs Reviewed  CBC WITH DIFFERENTIAL/PLATELET - Abnormal; Notable for the following components:      Result Value   WBC 14.0 (*)    RBC 3.26 (*)    Hemoglobin 9.7 (*)    HCT 28.4 (*)    Neutro Abs 10.5 (*)    Monocytes Absolute 1.2 (*)    All other components within normal limits  BASIC METABOLIC PANEL - Abnormal; Notable for the following components:   Potassium 3.0 (*)    Glucose, Bld 111 (*)    Calcium 8.8 (*)    GFR calc non Af Amer 59 (*)    All other components within normal limits  BRAIN NATRIURETIC PEPTIDE - Abnormal; Notable for the following components:   B Natriuretic Peptide 314.0 (*)    All other components within normal limits  TROPONIN I - Abnormal; Notable for the following components:   Troponin I 0.03 (*)    All other components within normal limits  BLOOD GAS, VENOUS - Abnormal; Notable for the following components:   pH, Ven 7.44 (*)    Bicarbonate 33.3 (*)    Acid-Base Excess 8.0 (*)    All other components within normal limits  CULTURE, BLOOD (ROUTINE X 2)  CULTURE, BLOOD (ROUTINE X 2)  LACTIC ACID, PLASMA  PROCALCITONIN  MAGNESIUM  PHOSPHORUS  HEPATIC FUNCTION PANEL  CALCIUM, IONIZED  URINALYSIS, COMPLETE (UACMP) WITH MICROSCOPIC  TROPONIN I   ____________________________________________  EKG  ED ECG REPORT I, Loleta Rose, the attending physician, personally viewed and interpreted this ECG.  Date: 09/08/2017 EKG Time: 2:14 AM Rate: 81 Rhythm: normal sinus rhythm QRS Axis: Borderline left axis deviation Intervals: normal ST/T Wave abnormalities: Non-specific ST segment / T-wave changes, but no evidence of acute ischemia. Narrative Interpretation: no evidence of acute ischemia   ____________________________________________  RADIOLOGY I, Loleta Rose, personally viewed and evaluated these images (plain radiographs) as part of my medical decision making, as well as  reviewing the written report by the radiologist.  ED MD interpretation: No clear evidence of pneumonia on chest x-ray, but CT scan suggests debris in the right mainstem bronchus suggestive of aspiration pneumonia as well as other changes other lobes of the lungs.  Official  radiology report(s): Dg Chest 1 View  Result Date: 09/08/2017 CLINICAL DATA:  Shortness of breath and cough.  History of COPD. EXAM: CHEST  1 VIEW COMPARISON:  Chest radiograph June 27, 2017 FINDINGS: Mild cardiomegaly is unchanged. Mildly calcified aortic arch. Similar chronic interstitial changes with chronically elevated RIGHT hemidiaphragm. Unchanged bandlike density RIGHT lung base. No pleural effusion or focal consolidation. No pneumothorax. Soft tissue planes and included osseous structures are non suspicious. IMPRESSION: Similar chronic interstitial changes with RIGHT lung base atelectasis/scarring. Stable cardiomegaly. Aortic Atherosclerosis (ICD10-I70.0). Electronically Signed   By: Awilda Metro M.D.   On: 09/08/2017 03:29   Ct Chest Wo Contrast  Result Date: 09/08/2017 CLINICAL DATA:  Difficulty breathing, cough.  History of COPD. EXAM: CT CHEST WITHOUT CONTRAST TECHNIQUE: Multidetector CT imaging of the chest was performed following the standard protocol without IV contrast. COMPARISON:  Chest radiograph September 08, 2017 and CT chest July 18, 2009 FINDINGS: CARDIOVASCULAR: The heart is mildly enlarged. Severe coronary artery calcifications. No pericardial effusion. Thoracic aorta is normal course and caliber, moderate calcific atherosclerosis. Mildly dense intima associated with anemia. Main pulmonary artery is enlarged at 3.5 cm seen with chronic pulmonary arterial hypertension. The heart is mildly enlarged. Moderate coronary artery calcifications. MEDIASTINUM/NODES: Fullness of the hila concerning for component lymphadenopathy. LUNGS/PLEURA: Debris of facing distal RIGHT main stem bronchus with partially aerated distal  bronchi. Narrowed bronchi bilaterally, debris within LEFT lobar bronchi. Consolidation RIGHT middle lobe, to lesser extent LEFT lower lobe and lingula. Small RIGHT upper lobe consolidation. RIGHT pleural thickening without pleural effusion. UPPER ABDOMEN: Nonacute. Elevated RIGHT hemidiaphragm. 4.6 cm simple cyst upper pole RIGHT kidney. 2.6 cm cyst LEFT upper pole. MUSCULOSKELETAL: Nonacute.  Multilevel severe spondylosis. IMPRESSION: 1. Debris obstructing RIGHT mainstem bronchus, probable aspiration. Debris obstructing distal LEFT bronchi. 2. Multifocal bibasilar atelectasis and/or pneumonia, most conspicuous in RIGHT middle lobe. 3. Suspected lymphadenopathy, possibly reactive. 4. Cardiomegaly. Aortic Atherosclerosis (ICD10-I70.0). Electronically Signed   By: Awilda Metro M.D.   On: 09/08/2017 06:40    ____________________________________________   PROCEDURES  Critical Care performed: No   Procedure(s) performed:   .Critical Care Performed by: Loleta Rose, MD Authorized by: Loleta Rose, MD   Critical care provider statement:    Critical care time (minutes):  30   Critical care time was exclusive of:  Separately billable procedures and treating other patients   Critical care was necessary to treat or prevent imminent or life-threatening deterioration of the following conditions:  Respiratory failure (HCAP, respiratory failure, possible sepsis)   Critical care was time spent personally by me on the following activities:  Development of treatment plan with patient or surrogate, discussions with consultants, evaluation of patient's response to treatment, examination of patient, obtaining history from patient or surrogate, ordering and performing treatments and interventions, ordering and review of laboratory studies, ordering and review of radiographic studies, pulse oximetry, re-evaluation of patient's condition and review of old  charts     ____________________________________________   INITIAL IMPRESSION / ASSESSMENT AND PLAN / ED COURSE  As part of my medical decision making, I reviewed the following data within the electronic MEDICAL RECORD NUMBER History obtained from family, Nursing notes reviewed and incorporated, Labs reviewed , EKG interpreted , Old chart reviewed, Radiograph reviewed , Discussed with admitting physician  and Notes from prior ED visits    Differential diagnosis includes, but is not limited to, COPD exacerbation, pneumonia, viral infection, CHF exacerbation.  The patient is in no distress right now but is apparently having an  oxygen requirement which is not normal for her.  She had a run of what appears to be A. fib with RVR while I was in the room but that resolved spontaneously, uncertain how this factors into the current clinical presentation.  She was asymptomatic during the episode of tachycardia.  I confirm that she is DNR/DNI.  I am obtaining lab work and chest x-ray and will monitor carefully we will hold off on additional breathing treatments at this time.    Clinical Course as of Sep 08 700  Tue Sep 08, 2017  0328 Lactic Acid, Venous: 1.1 [CF]  0700 (Note that documentation was delayed due to multiple ED patients requiring immediate care.)  The patient's lab work-up was reassuring with a Leukocytosis of 14 but otherwise generally unremarkable.  She also has a potassium of 3 and a slightly elevated troponin of 0.03 likely representative of demand ischemia.  BNP is very slightly elevated at 314.  Venous blood gas is generally reassuring.  In spite of her new oxygen requirement she does not meet criteria for sepsis and has had normal blood pressures throughout.  I am holding off on IV fluids given the possibility she could have a little bit of CHF.  But more concerning is the fact that she does have the oxygen requirement and her CT scan is suggestive of aspiration pneumonia versus healthcare  associated pneumonia.  I am treating her with Zosyn 3.375 g IV and vancomycin 1 g IV to include anaerobic coverage and I discussed the case with the hospitalist who will admit.  I updated family as well.   [CF]    Clinical Course User Index [CF] Loleta Rose, MD    ____________________________________________  FINAL CLINICAL IMPRESSION(S) / ED DIAGNOSES  Final diagnoses:  Acute respiratory failure with hypoxemia (HCC)  HCAP (healthcare-associated pneumonia)  Aspiration pneumonia, unspecified aspiration pneumonia type, unspecified laterality, unspecified part of lung (HCC)     MEDICATIONS GIVEN DURING THIS VISIT:  Medications  vancomycin (VANCOCIN) IVPB 1000 mg/200 mL premix (has no administration in time range)  piperacillin-tazobactam (ZOSYN) IVPB 3.375 g (3.375 g Intravenous New Bag/Given 09/08/17 0647)  lactated ringers with KCl 20 mEq/L infusion (has no administration in time range)  potassium chloride 10 mEq in 100 mL IVPB (has no administration in time range)  ipratropium-albuterol (DUONEB) 0.5-2.5 (3) MG/3ML nebulizer solution 3 mL (has no administration in time range)  fondaparinux (ARIXTRA) injection 2.5 mg (has no administration in time range)     ED Discharge Orders    None       Note:  This document was prepared using Dragon voice recognition software and may include unintentional dictation errors.    Loleta Rose, MD 09/08/17 5784    Loleta Rose, MD 09/23/17 1058

## 2017-09-08 NOTE — ED Notes (Signed)
Lab called with critical trop of 0.03 Dr. York Cerise made aware.

## 2017-09-08 NOTE — ED Notes (Signed)
Patient transported to room 244 

## 2017-09-08 NOTE — Progress Notes (Signed)
Palliative:  Brief meeting with patient, son, and daughter-in-law to introduce palliative care. Patient is currently being evaluated by SLP. Patient has another daughter who cannot be at the hospital until tomorrow, family asks that we meet tomorrow (9/4) at 11 AM. Contact information provided to family.   Thank you for this consult.  NO CHARGE  Gerlean Ren, DNP, [facility] Palliative Medicine Team Team Phone # [phone]  Pager # [phone]

## 2017-09-08 NOTE — Progress Notes (Signed)
Please note patient is currently followed by outpatient Palliative at [facility]. CSW Windell Moulding and hospital Palliative NP Harvest Dark made aware.  Dayna Barker RN, BSN, [facility] [place], hospital Liaison [phone]

## 2017-09-08 NOTE — Progress Notes (Signed)
Pharmacy Antibiotic Note  Amanda Mercado is a 84 y.o. female admitted on 09/08/2017 with pneumonia.  Pharmacy has been consulted for Vancomycin and Zosyn dosing.  Patient from assisted living facility  Plan: patient received Zosyn 3.375gm x 1 in ER. Vancomycin 1 gram IV x 1 given on floor. Will continue with stack dosing of Vancomycin. Vancomycin 1000 IV every 24 hours.  Goal trough 15-20 mcg/mL. Zosyn 3.375g IV q8h (4 hour infusion).  ke 0.035  t 1/2 19.8  Vd 46.8   Will check trough prior to 5th dose Watch renal fxn with Vancomycin/Zosyn combination   Height: 5\' 4"  (162.6 cm) Weight: 154 lb (69.9 kg) IBW/kg (Calculated) : 54.7  Temp (24hrs), Avg:98.3 F (36.8 C), Min:97.3 F (36.3 C), Max:99.2 F (37.3 C)  Recent Labs  Lab 09/08/17 0230  WBC 14.0*  CREATININE 0.83  LATICACIDVEN 1.1    Estimated Creatinine Clearance: 40.6 mL/min (by C-G formula based on SCr of 0.83 mg/dL).    Allergies  Allergen Reactions  . Doxycycline Calcium Other (See Comments)  . Raloxifene Other (See Comments)  . Rofecoxib Other (See Comments)  . Statins Other (See Comments)    Entire body aches, bones and muscles  . Sulfa Antibiotics Other (See Comments)    Antimicrobials this admission: Zosyn 9/3 >>   Vanc 9/3 >>    Dose adjustments this admission:    Microbiology results: 9/3 BCx: pending   UCx:     9/3 Sputum: pending   9/3  MRSA PCR:  pending  Thank you for allowing pharmacy to be a part of this patient's care.  Amanda Mercado A 09/08/2017 10:48 AM

## 2017-09-08 NOTE — H&P (Addendum)
[facility] [place] at [facility]   PATIENT NAME: Amanda Mercado    MR#:  [id number]  DATE OF BIRTH:  May 19, 1921  DATE OF ADMISSION:  09/08/2017  PRIMARY CARE PHYSICIAN: System, Pcp Not In   REQUESTING/REFERRING PHYSICIAN: Loleta Rose, MD  CHIEF COMPLAINT:   Chief Complaint  Patient presents with  . Shortness of Breath    HISTORY OF PRESENT ILLNESS:  Amanda Mercado  is a 84 y.o. female with a known history of T2NIDDM, HTN, hypothyroidism, COPD, GERD, dementia (AAOx2, person + place) who presents from long-term care facility [facility]) w/ cough/SOB and hypoxemic respiratory failure. Pt responds to name, and can tell me that she is in [place], but cannot tell me the year or the name of POTUS. She is lethargic, and is unable to provide Hx/ROS. She tells me she feels "okay". Pt's family (son/DPOA and daughter-in-law) are at bedside. They tell me that pt had been having cough for ~1wk. They tell me the facility did not report if cough was productive. Family was also not made aware of any fever. Pt has apparently been receiving nebulizer treatments at the facility to help with the cough, but I am told that the pt won't take the treatment unless somebody quite literally sits next to her on a 1:1 basis and essentially holds the device directly in front of her face for her. Family visited her during the day on Monday 09/02, but felt she was at her baseline with regards to health and mentation, though they observed (and were also notified of) poor PO intake to food and fluids x1-2d. Pt apparently became acutely dyspneic later in the evening, prompting the facility to contact EMS. Pt was reportedly hypoxic w/ SpO2 90% on room air on EMS arrival. Stable SpO2 > 96% on 2L [place] at the time of my assessment. Pt does not appear to be in distress, but is warm and clammy/diaphoretic. (+) coarse rhonchi diffusely on lung exam. SIRS (+), tachypnea, hypoxia, leukocytosis. Dry CT chest report,  "Debris obstructing right mainstem bronchus, probable aspiration. Debris obstructing distal left bronchi. Multifocal bibasilar atelectasis and/or pneumonia, most conspicuous in right middle lobe." Admit for acute hypoxemic respiratory failure, sepsis 2/2 pneumonia (acute bacterial, aspiration +/- HCAP).  PAST MEDICAL HISTORY:   Past Medical History:  Diagnosis Date  . Arthritis    ra  . COPD (chronic obstructive pulmonary disease) (HCC)   . Diabetes mellitus without complication (HCC)   . Dizziness    occ  . GERD (gastroesophageal reflux disease)   . HOH (hard of hearing)   . Hypertension   . Hypothyroidism   . Psoriasis   . Rosacea   . Shortness of breath dyspnea     PAST SURGICAL HISTORY:   Past Surgical History:  Procedure Laterality Date  . ABDOMINAL HYSTERECTOMY    . ANTERIOR VITRECTOMY Left 08/01/2014   Procedure: ANTERIOR VITRECTOMY;  Surgeon: Galen Manila, MD;  Location: [facility] ORS;  Service: Ophthalmology;  Laterality: Left;  . BACK SURGERY    . CATARACT EXTRACTION W/PHACO Right 07/18/2014   Procedure: CATARACT EXTRACTION PHACO AND INTRAOCULAR LENS PLACEMENT (IOC);  Surgeon: Galen Manila, MD;  Location: [facility] ORS;  Service: Ophthalmology;  Laterality: Right;  [place] 01:18 AP% 26.9 CDE 21.20 fluid pack lot #[id number]  . CATARACT EXTRACTION W/PHACO Left 08/01/2014   Procedure: CATARACT EXTRACTION PHACO AND INTRAOCULAR LENS PLACEMENT (IOC);  Surgeon: Galen Manila, MD;  Location: [facility] ORS;  Service: Ophthalmology;  Laterality: Left;  cassette lot # [id number]  [place] 1:35.6AP  25.3 CDE 24.7    SOCIAL HISTORY:   Social History   Tobacco Use  . Smoking status: Former Games developer  . Smokeless tobacco: Never Used  Substance Use Topics  . Alcohol use: No    FAMILY HISTORY:  History reviewed. No pertinent family history.  DRUG ALLERGIES:   Allergies  Allergen Reactions  . Doxycycline Calcium Other (See Comments)  . Raloxifene Other (See Comments)  . Rofecoxib Other  (See Comments)  . Statins Other (See Comments)    Entire body aches, bones and muscles  . Sulfa Antibiotics Other (See Comments)    REVIEW OF SYSTEMS:   Review of Systems  Unable to perform ROS: Dementia  Respiratory: Positive for cough and shortness of breath.    Pt unable to provide ROS, dementia + lethargy, only says she feels "okay". MEDICATIONS AT HOME:   Prior to Admission medications   Medication Sig Start Date End Date Taking? Authorizing Provider  acetaminophen (TYLENOL) 500 MG tablet Take 500 mg by mouth every 4 (four) hours as needed for mild pain.    Yes [provider]  albuterol (PROVENTIL HFA;VENTOLIN HFA) 108 (90 Base) MCG/ACT inhaler Inhale 2 puffs into the lungs every 4 (four) hours as needed for wheezing or shortness of breath.   Yes [provider]  brimonidine-timolol (COMBIGAN) 0.2-0.5 % ophthalmic solution Place 1 drop into the left eye 2 (two) times daily.    Yes [provider]  Cholecalciferol (VITAMIN D) 2000 units tablet Take 4,000 Units by mouth daily.   Yes [provider]  Cyanocobalamin (VITAMIN B-12) 1000 MCG SUBL Place 1,000 mcg under the tongue daily.   Yes [provider]  dimenhyDRINATE (DRAMAMINE) 50 MG CHEW Chew 1 tablet by mouth every 4 (four) hours as needed (for dizziness).    Yes [provider]  ezetimibe (ZETIA) 10 MG tablet Take 10 mg by mouth daily.   Yes [provider]  Ginkgo Biloba 60 MG CAPS Take 120 mg by mouth daily.   Yes [provider]  hydrocortisone (ANUSOL-HC) 25 MG suppository Place 25 mg rectally 2 (two) times daily as needed for hemorrhoids.    Yes [provider]  levothyroxine (SYNTHROID, LEVOTHROID) 75 MCG tablet Take 75 mcg by mouth daily before breakfast.   Yes [provider]  loratadine (CLARITIN) 10 MG tablet Take 10 mg by mouth daily.   Yes [provider]  losartan-hydrochlorothiazide (HYZAAR) 100-12.5 MG tablet Take 1  tablet by mouth daily.   Yes [provider]  losartan-hydrochlorothiazide (HYZAAR) 100-25 MG tablet Take 1 tablet by mouth daily.   Yes [provider]  magnesium oxide (MAG-OX) 400 MG tablet Take 400 mg by mouth daily.   Yes [provider]  meclizine (ANTIVERT) 25 MG tablet Take 25 mg by mouth every 8 (eight) hours as needed for dizziness.    Yes [provider]  mirtazapine (REMERON) 7.5 MG tablet Take 7.5 mg by mouth at bedtime.    Yes [provider]  polyethylene glycol (MIRALAX / GLYCOLAX) packet Take 17 g by mouth daily.    Yes [provider]  potassium chloride (MICRO-K) 10 MEQ CR capsule Take 10 mEq by mouth daily.   Yes [provider]  sennosides-docusate sodium (SENOKOT-S) 8.6-50 MG tablet Take 2 tablets by mouth daily.    Yes [provider]  sertraline (ZOLOFT) 50 MG tablet Take 50 mg by mouth daily.   Yes [provider]  traMADol Janean Sark) 50  MG tablet Take 50 mg by mouth 3 (three) times daily.    Yes [provider]      VITAL SIGNS:  Blood pressure 127/73, pulse 76, temperature 99.2 F (37.3 C), temperature source Oral, resp. rate (!) 24, height 5\' 4"  (1.626 m), weight 85 kg, SpO2 96 %.  PHYSICAL EXAMINATION:  Physical Exam  Constitutional: She appears well-developed and well-nourished. She appears lethargic. She is sleeping. She appears toxic. She has a sickly appearance. She appears ill. No distress. She is not intubated. Nasal cannula in place.  HENT:  Head: Atraumatic.  Mouth/Throat: Oropharynx is clear and moist. No oropharyngeal exudate or posterior oropharyngeal edema.  Eyes: Conjunctivae, EOM and lids are normal. No scleral icterus.  Neck: Neck supple. No JVD present. No thyromegaly present.  Cardiovascular: Normal rate, regular rhythm, S1 normal, S2 normal and normal heart sounds.  No extrasystoles are present. Exam reveals no gallop, no S3, no S4, no distant heart sounds  and no friction rub.  No murmur heard. Pulmonary/Chest: Effort normal. No accessory muscle usage or stridor. Tachypnea noted. No apnea and no bradypnea. She is not intubated. No respiratory distress. She has no decreased breath sounds. She has no wheezes. She has rhonchi in the right upper field, the right middle field, the right lower field, the left upper field, the left middle field and the left lower field. She has no rales.  Abdominal: Soft. Bowel sounds are normal. She exhibits no distension. There is no tenderness. There is no rigidity, no rebound and no guarding.  Musculoskeletal: Normal range of motion. She exhibits no edema or tenderness.       Right lower leg: Normal. She exhibits no tenderness and no edema.       Left lower leg: Normal. She exhibits no tenderness and no edema.  Lymphadenopathy:    She has no cervical adenopathy.  Neurological: She appears lethargic. She is disoriented.  (+) dementia, AAOx2 (person + place).  Skin: Skin is warm and intact. No rash noted. She is diaphoretic. No erythema.  Psychiatric: Judgment normal. Her mood appears not anxious. Her affect is not angry. Her speech is delayed. Her speech is not rapid and/or pressured and not slurred. She is slowed and withdrawn. She is not agitated, not aggressive, not hyperactive, not actively hallucinating and not combative. Cognition and memory are impaired. She does not exhibit a depressed mood. She is communicative. She exhibits abnormal recent memory and abnormal remote memory. She is attentive.   LABORATORY PANEL:   CBC Recent Labs  Lab 09/08/17 0230  WBC 14.0*  HGB 9.7*  HCT 28.4*  PLT 267   ------------------------------------------------------------------------------------------------------------------  Chemistries  Recent Labs  Lab 09/08/17 0230  NA 139  K 3.0*  CL 101  CO2 32  GLUCOSE 111*  BUN 16  CREATININE 0.83  CALCIUM 8.8*    ------------------------------------------------------------------------------------------------------------------  Cardiac Enzymes Recent Labs  Lab 09/08/17 0230  TROPONINI 0.03*   ------------------------------------------------------------------------------------------------------------------  RADIOLOGY:  Dg Chest 1 View  Result Date: 09/08/2017 CLINICAL DATA:  Shortness of breath and cough.  History of COPD. EXAM: CHEST  1 VIEW COMPARISON:  Chest radiograph June 27, 2017 FINDINGS: Mild cardiomegaly is unchanged. Mildly calcified aortic arch. Similar chronic interstitial changes with chronically elevated RIGHT hemidiaphragm. Unchanged bandlike density RIGHT lung base. No pleural effusion or focal consolidation. No pneumothorax. Soft tissue planes and included osseous structures are non suspicious. IMPRESSION: Similar chronic interstitial changes with RIGHT lung base atelectasis/scarring. Stable cardiomegaly. Aortic Atherosclerosis (ICD10-I70.0). Electronically Signed  By: Awilda Metro M.D.   On: 09/08/2017 03:29   Ct Chest Wo Contrast  Result Date: 09/08/2017 CLINICAL DATA:  Difficulty breathing, cough.  History of COPD. EXAM: CT CHEST WITHOUT CONTRAST TECHNIQUE: Multidetector CT imaging of the chest was performed following the standard protocol without IV contrast. COMPARISON:  Chest radiograph September 08, 2017 and CT chest July 18, 2009 FINDINGS: CARDIOVASCULAR: The heart is mildly enlarged. Severe coronary artery calcifications. No pericardial effusion. Thoracic aorta is normal course and caliber, moderate calcific atherosclerosis. Mildly dense intima associated with anemia. Main pulmonary artery is enlarged at 3.5 cm seen with chronic pulmonary arterial hypertension. The heart is mildly enlarged. Moderate coronary artery calcifications. MEDIASTINUM/NODES: Fullness of the hila concerning for component lymphadenopathy. LUNGS/PLEURA: Debris of facing distal RIGHT main stem bronchus with  partially aerated distal bronchi. Narrowed bronchi bilaterally, debris within LEFT lobar bronchi. Consolidation RIGHT middle lobe, to lesser extent LEFT lower lobe and lingula. Small RIGHT upper lobe consolidation. RIGHT pleural thickening without pleural effusion. UPPER ABDOMEN: Nonacute. Elevated RIGHT hemidiaphragm. 4.6 cm simple cyst upper pole RIGHT kidney. 2.6 cm cyst LEFT upper pole. MUSCULOSKELETAL: Nonacute.  Multilevel severe spondylosis. IMPRESSION: 1. Debris obstructing RIGHT mainstem bronchus, probable aspiration. Debris obstructing distal LEFT bronchi. 2. Multifocal bibasilar atelectasis and/or pneumonia, most conspicuous in RIGHT middle lobe. 3. Suspected lymphadenopathy, possibly reactive. 4. Cardiomegaly. Aortic Atherosclerosis (ICD10-I70.0). Electronically Signed   By: Awilda Metro M.D.   On: 09/08/2017 06:40   IMPRESSION AND PLAN:   A/P: 4F w/ dementia (AAOx2) p/w cough/SOB, acute hypoxemic respiratory failure. SIRS (+), CT chest (+) aspiration + multifocal pneumonia (acute bacterial, aspiration +/- HCAP), sepsis. Hypokalemia, hyperglycemia (w/ T2NIDDM), hypocalcemia, BNP elevation, leukocytosis, normocytic anemia. -Cough/SOB, acute hypoxemic respiratory failure, SIRS, aspiration, multifocal pneumonia, sepsis: Tachypnea, hypoxia, WBC 14.0, SIRS (+). CT chest report, "Debris obstructing right mainstem bronchus, probable aspiration. Debris obstructing distal left bronchi. Multifocal bibasilar atelectasis and/or pneumonia, most conspicuous in right middle lobe." High suspicion for aspiration, but pt also resides at a long-term care facility [facility]). Will cover broadly w/ Vancomycin + Zosyn for HCAP, thought pt's clinical stability likely portends the opportunity to de-escalate to Unasyn in summary fashion. Lactate 1.1. PCT, BCx, U/A, Sputum Cx/gram, UStrep + ULegionella Ag pending. NPO, aspiration precautions. Pulse ox. Pt not wheezing, (-) acute COPD exacerbation. Does not appear  volume overloaded. -Aspiration: NPO. Aspiration precautions. SLP evaluation. Have had a candid discussion w/ family regarding aspiration in the setting of dementia. Family tells me pt is stubborn. They are concerned that pt will not internalize and reproduce the interventions and behaviors that SLP may potentially be willing to teach the pt in order to treat and prevent aspiration. As such, teaching may be ineffectual 2/2 avolition. They have already been preemptively made aware that PEG tubes do not prevent aspiration. I also expect the pt to dislike the dysphagia diet (as most pts do), which may lead to poor intake and malnourishment. There is no clear solution to this problem. I have informed the family that this singular issue may potentially lead to pt's deterioration and a whole host of other issues in the long term. The family superficially appears to understand. -Hypokalemia: Replete and monitor. Mag level pending. -Hyperglycemia/T2NIDDM: SSI. Hold PO antihyperglycemics. -Hypocalcemia: Ionized calcium. -BNP elevation: BNP 314. Etiology unclear. Does not appear clinically volume overloaded. Administering IVF 2/2 sepsis. No documented Hx of CHF. May be related to age-related diastolic dysfxn. -Normocytic anemia: Hgb 9.7 on present admission. Hgb was 11.5 on 08/28/2017. MCV 87.0.  No report of obvious blood loss in the interrim. Monitor. -Dementia: AAOx2. Has some degree of memory impairment, but does not forget family, friends or familiars. (-) loss of appetite. Pt likely w/ early to moderate dementia, appears to not yet have become severe. Family made aware of natural Hx of illness. -c/w other home meds. -FEN/GI: NPO for now. -DVT PPx: Lovenox. -Code status: DNR/DNI. -Disposition: Admission, > 2 midnights.   All the records are reviewed and case discussed with ED provider. Management plans discussed with the patient, family and they are in agreement.  CODE STATUS: DNR/DNI.  TOTAL TIME  TAKING CARE OF THIS PATIENT: 90 minutes.    Barbaraann Rondo M.D on 09/08/2017 at 8:07 AM  Between 7am to 6pm - Pager - [phone]  After 6pm go to [email or web address] - Social research officer, government  [facility] [place] [facility]  Office  [phone]  CC: Primary care physician; System, Pcp Not In   Note: This dictation was prepared with Dragon dictation along with smaller phrase technology. Any transcriptional errors that result from this process are unintentional.

## 2017-09-08 NOTE — Progress Notes (Signed)
[facility] - [place] at [facility]   PATIENT NAME: Amanda Mercado    MR#:  [id number]  DATE OF BIRTH:  1921-08-23  SUBJECTIVE:  CHIEF COMPLAINT:   Chief Complaint  Patient presents with  . Shortness of Breath   Sent from her long-term nursing home care with worsening hypoxia. Noted to have aspiration pneumonia on CT scan.  Patient is alert but not oriented, I have cough with rattling sounds.  Her daughter-in-law was present in the room during my visit.  REVIEW OF SYSTEMS:   Patient was pleasantly demented and had no complaints.  ROS  DRUG ALLERGIES:   Allergies  Allergen Reactions  . Doxycycline Calcium Other (See Comments)  . Raloxifene Other (See Comments)  . Rofecoxib Other (See Comments)  . Statins Other (See Comments)    Entire body aches, bones and muscles  . Sulfa Antibiotics Other (See Comments)    VITALS:  Blood pressure 131/65, pulse 76, temperature (!) 97.3 F (36.3 C), temperature source Oral, resp. rate 16, height 5\' 4"  (1.626 m), weight 69.9 kg, SpO2 96 %.  PHYSICAL EXAMINATION:  GENERAL:  84 y.o.-year-old patient lying in the bed with no acute distress.  EYES: Pupils equal, round, reactive to light and accommodation. No scleral icterus. Extraocular muscles intact.  HEENT: Head atraumatic, normocephalic. Oropharynx and nasopharynx clear.  NECK:  Supple, no jugular venous distention. No thyroid enlargement, no tenderness.  LUNGS: Normal breath sounds bilaterally, no wheezing, bilateral crepitation. No use of accessory muscles of respiration.  On supplemental oxygen. CARDIOVASCULAR: S1, S2 normal. No murmurs, rubs, or gallops.  ABDOMEN: Soft, nontender, nondistended. Bowel sounds present. No organomegaly or mass.  EXTREMITIES: No pedal edema, cyanosis, or clubbing.  NEUROLOGIC: Cranial nerves II through XII are intact. Muscle strength 3/5 in all extremities. Sensation intact. Gait not checked.  PSYCHIATRIC: The patient is alert and oriented  x 0.  SKIN: No obvious rash, lesion, or ulcer.   Physical Exam LABORATORY PANEL:   CBC Recent Labs  Lab 09/08/17 0230  WBC 14.0*  HGB 9.7*  HCT 28.4*  PLT 267   ------------------------------------------------------------------------------------------------------------------  Chemistries  Recent Labs  Lab 09/08/17 0230 09/08/17 1041  NA 139  --   K 3.0*  --   CL 101  --   CO2 32  --   GLUCOSE 111*  --   BUN 16  --   CREATININE 0.83  --   CALCIUM 8.8*  --   MG  --  1.8  AST  --  18  ALT  --  12  ALKPHOS  --  73  BILITOT  --  0.8   ------------------------------------------------------------------------------------------------------------------  Cardiac Enzymes Recent Labs  Lab 09/08/17 0230 09/08/17 1041  TROPONINI 0.03* <0.03   ------------------------------------------------------------------------------------------------------------------  RADIOLOGY:  Dg Chest 1 View  Result Date: 09/08/2017 CLINICAL DATA:  Shortness of breath and cough.  History of COPD. EXAM: CHEST  1 VIEW COMPARISON:  Chest radiograph June 27, 2017 FINDINGS: Mild cardiomegaly is unchanged. Mildly calcified aortic arch. Similar chronic interstitial changes with chronically elevated RIGHT hemidiaphragm. Unchanged bandlike density RIGHT lung base. No pleural effusion or focal consolidation. No pneumothorax. Soft tissue planes and included osseous structures are non suspicious. IMPRESSION: Similar chronic interstitial changes with RIGHT lung base atelectasis/scarring. Stable cardiomegaly. Aortic Atherosclerosis (ICD10-I70.0). Electronically Signed   By: June 29, 2017 M.D.   On: 09/08/2017 03:29   Ct Chest Wo Contrast  Result Date: 09/08/2017 CLINICAL DATA:  Difficulty breathing, cough.  History of COPD. EXAM: CT CHEST  WITHOUT CONTRAST TECHNIQUE: Multidetector CT imaging of the chest was performed following the standard protocol without IV contrast. COMPARISON:  Chest radiograph September 08, 2017 and CT chest July 18, 2009 FINDINGS: CARDIOVASCULAR: The heart is mildly enlarged. Severe coronary artery calcifications. No pericardial effusion. Thoracic aorta is normal course and caliber, moderate calcific atherosclerosis. Mildly dense intima associated with anemia. Main pulmonary artery is enlarged at 3.5 cm seen with chronic pulmonary arterial hypertension. The heart is mildly enlarged. Moderate coronary artery calcifications. MEDIASTINUM/NODES: Fullness of the hila concerning for component lymphadenopathy. LUNGS/PLEURA: Debris of facing distal RIGHT main stem bronchus with partially aerated distal bronchi. Narrowed bronchi bilaterally, debris within LEFT lobar bronchi. Consolidation RIGHT middle lobe, to lesser extent LEFT lower lobe and lingula. Small RIGHT upper lobe consolidation. RIGHT pleural thickening without pleural effusion. UPPER ABDOMEN: Nonacute. Elevated RIGHT hemidiaphragm. 4.6 cm simple cyst upper pole RIGHT kidney. 2.6 cm cyst LEFT upper pole. MUSCULOSKELETAL: Nonacute.  Multilevel severe spondylosis. IMPRESSION: 1. Debris obstructing RIGHT mainstem bronchus, probable aspiration. Debris obstructing distal LEFT bronchi. 2. Multifocal bibasilar atelectasis and/or pneumonia, most conspicuous in RIGHT middle lobe. 3. Suspected lymphadenopathy, possibly reactive. 4. Cardiomegaly. Aortic Atherosclerosis (ICD10-I70.0). Electronically Signed   By: Awilda Metro M.D.   On: 09/08/2017 06:40    ASSESSMENT AND PLAN:   Active Problems:   Acute hypoxemic respiratory failure (HCC)   Aspiration pneumonia (HCC)   Palliative care by specialist   Goals of care, counseling/discussion  *Aspiration pneumonia Given Vanco and Zosyn on admission, MRSA PCR negative so stopped vancomycin. Keep n.p.o. until swallow evaluation, continue on IV fluids. Follow cultures. Due to her old age, dementia, poor baseline status-she has very high risk of worsening condition and if recovers from this episode  than high risk for recurrent admissions. Discussed this with her daughter-in-law was present in the room.  Suggested to have a meeting with palliative care to clarify her goals of treatment.  *Hypokalemia Replace IV.  Check magnesium.  *Sinus tachycardia This is in response to infection and some hypoxia, IV fluids and monitor.  *Hypertension Continue home medications.  All the records are reviewed and case discussed with Care Management/Social Workerr. Management plans discussed with the patient, family and they are in agreement.  CODE STATUS: DNR  TOTAL TIME TAKING CARE OF THIS PATIENT: 35 minutes.    POSSIBLE D/C IN 1-2 DAYS, DEPENDING ON CLINICAL CONDITION.   Altamese Dilling M.D on 09/08/2017   Between 7am to 6pm - Pager - [phone]  After 6pm go to [email or web address] - password [email or web address]  [facility] [place] [facility]  Office  [phone]  CC: Primary care physician; System, Pcp Not In  Note: This dictation was prepared with Dragon dictation along with smaller phrase technology. Any transcriptional errors that result from this process are unintentional.

## 2017-09-08 NOTE — Progress Notes (Signed)
No output since 0900. Bladder scanned yielded >1400 cc. Dr. Elisabeth Pigeon notified and received orders for foley catheter. Will continue to monitor.

## 2017-09-08 NOTE — Evaluation (Addendum)
Clinical/Bedside Swallow Evaluation Patient Details  Name: Amanda Mercado MRN: [id number] Date of Birth: October 14, 1921  Today's Date: 09/08/2017 Time: SLP Start Time (ACUTE ONLY): 1040 SLP Stop Time (ACUTE ONLY): 1140 SLP Time Calculation (min) (ACUTE ONLY): 60 min  Past Medical History:  Past Medical History:  Diagnosis Date  . Arthritis    ra  . COPD (chronic obstructive pulmonary disease) (HCC)   . Diabetes mellitus without complication (HCC)   . Dizziness    occ  . GERD (gastroesophageal reflux disease)   . HOH (hard of hearing)   . Hypertension   . Hypothyroidism   . Psoriasis   . Rosacea   . Shortness of breath dyspnea    Past Surgical History:  Past Surgical History:  Procedure Laterality Date  . ABDOMINAL HYSTERECTOMY    . ANTERIOR VITRECTOMY Left 08/01/2014   Procedure: ANTERIOR VITRECTOMY;  Surgeon: Galen Manila, MD;  Location: [facility] ORS;  Service: Ophthalmology;  Laterality: Left;  . BACK SURGERY    . CATARACT EXTRACTION W/PHACO Right 07/18/2014   Procedure: CATARACT EXTRACTION PHACO AND INTRAOCULAR LENS PLACEMENT (IOC);  Surgeon: Galen Manila, MD;  Location: [facility] ORS;  Service: Ophthalmology;  Laterality: Right;  [place] 01:18 AP% 26.9 CDE 21.20 fluid pack lot #[id number]  . CATARACT EXTRACTION W/PHACO Left 08/01/2014   Procedure: CATARACT EXTRACTION PHACO AND INTRAOCULAR LENS PLACEMENT (IOC);  Surgeon: Galen Manila, MD;  Location: [facility] ORS;  Service: Ophthalmology;  Laterality: Left;  cassette lot # [id number]   [place] 1:35.6AP  25.3 CDE 24.7   HPI:   Pt is a 84 y.o. female with a known history of T2NIDDM, HTN, hypothyroidism, COPD, GERD, Dementia (AAOx2, person + place) who presents from long-term care facility [facility]) w/ cough/SOB and hypoxemic respiratory failure. Pt responds to name, and can tell me that she is in [place], but cannot tell the year. Initially lethargic in the ED, now is currently awake, verbally responsive and can engage in conversation and  follow basic commands. Pt is HOH. Pt has had a cough for ~1wk. They tell me the facility did not report if cough was productive. Family was also not made aware of any fever. Pt has apparently been receiving nebulizer treatments at the facility to help with the cough, but I am told that the pt won't take the treatment unless somebody quite literally sits next to her on a 1:1 basis and essentially holds the device directly in front of her face for her. Family visited her during the day on Monday 09/02, but felt she was at her baseline with regards to health and mentation, though they observed (and were also notified of) poor PO intake to food and fluids during the 1-2 days. Pt apparently became acutely dyspneic later that evening, prompting the facility to contact EMS. Pt was reportedly hypoxic w/ SpO2 90% on room air on EMS arrival. Stable SpO2 > 96% on 2L [place] at the time of ED assessment. Pt does not appear to be in distress, but is warm and clammy/diaphoretic. (+) coarse rhonchi diffusely on lung exam. SIRS (+), tachypnea, hypoxia, leukocytosis. Dry CT chest report, "Debris obstructing right mainstem bronchus, probable aspiration. Debris obstructing distal left bronchi. Multifocal bibasilar atelectasis and/or pneumonia, most conspicuous in right middle lobe." Pt does have GERD and has been more sedentary in bed per report. Pt was admitted for acute hypoxemic respiratory failure, sepsis 2/2 pneumonia (acute bacterial, aspiration +/- HCAP). Pt is followed by Palliative/Hospice services at [facility].  Assessment / Plan / Recommendation Clinical  Impression  Pt appears to present w/ adequate oropharyngeal phase swallowing function and bolus management w/ no immediate, overt s/s of aspiration noted during po trials at this eval. Pt was awake and verbally responded to general conversation; followed basic commands. She appears at reduced risk for aspiration of po's when following general aspiration precautions.  However, pt does have GERD at baseline and ANY Esophageal dysmotility and/or retrograde bolus activity could increase risk for aspiration of Reflux material.  Pt consumed po trials of thin liquids via Cup then purees and soft solids w/ no immediate coughing, decline in respiratory presentation/effort, and no decline in vocal quality. Pt did exhibit a mild+ congested cough PRIOR to presentation of po trials and 2x during the evaluation, but the congested cough did not appear directly related to po trials consumed and did not increase in intensity or frequency as the po trials continued. Pt fed self w/ min support. Oral phase appeared grossly [facility] for bolus management of the trials; mastication fairly timely and efficient for A-P transfer and oral clearing noted post trials. OM exam appeared [facility] w/ no unilateral weakness noted. Recommend a Dysphagia level 3 (mech soft) diet w/ thin liquids; general aspiration precautions; feeding support and monitoring at meals d/t deconditioned status; Pills in puree for easier, safer swallowing. Recommend Dietitian f/u for nutritional support as needed. Reflux precautions d/t baseline GERD dx.   SLP Visit Diagnosis: Dysphagia, oral phase (R13.11);Dysphagia, unspecified (R13.10)    Aspiration Risk  (reduced when following precautions)    Diet Recommendation  Dysphagia level 3 (Mech soft foods, moistened); thin liquids. General aspiration precautions; Reflux precautions as pt has GERD at baseline  Medication Administration: Whole meds with puree(for safer swallowing)    Other  Recommendations Recommended Consults: Consider GI evaluation(Dietician f/u, as needed) Oral Care Recommendations: Oral care BID;Patient independent with oral care;Staff/trained caregiver to provide oral care Other Recommendations: (n/a)   Follow up Recommendations None      Frequency and Duration min 1 x/week  1 week       Prognosis Prognosis for Safe Diet Advancement: Fair(-Good) Barriers  to Reach Goals: (advanced age)      Swallow Study   General Date of Onset: 09/08/17 Type of Study: Bedside Swallow Evaluation Previous Swallow Assessment: none reported Diet Prior to this Study: NPO(regular diet at home per report) Temperature Spikes Noted: No(wbc 14) Respiratory Status: Nasal cannula(2 liters) History of Recent Intubation: No Behavior/Cognition: Alert;Cooperative;Pleasant mood;Distractible;Requires cueing(min HOH) Oral Cavity Assessment: Within Functional Limits Oral Care Completed by SLP: Yes Oral Cavity - Dentition: Adequate natural dentition Vision: Functional for self-feeding Self-Feeding Abilities: Able to feed self;Needs assist;Needs set up Patient Positioning: Upright in bed(needed positioning) Baseline Vocal Quality: Low vocal intensity(min+ Dysphonia - decreased breath support, shaky) Volitional Cough: Strong;Congested(min) Volitional Swallow: Able to elicit    Oral/Motor/Sensory Function Overall Oral Motor/Sensory Function: Within functional limits   Ice Chips Ice chips: Within functional limits Presentation: Spoon(fed; 4 trials)   Thin Liquid Thin Liquid: Within functional limits Presentation: Cup;Self Fed(11 trials (Multiple sip trials x2)) Other Comments: pt encouraged to take her time during trials    Nectar Thick Nectar Thick Liquid: Not tested   Honey Thick Honey Thick Liquid: Not tested   Puree Puree: Within functional limits Presentation: Self Fed;Spoon(8 trials)   Solid     Solid: Impaired(mech soft trials) Presentation: Self Fed;Spoon(5 trials) Oral Phase Impairments: (min extra time for mastication and clearing) Oral Phase Functional Implications: (min extra time ) Pharyngeal Phase Impairments: (none)  Jerilynn Som, MS, CCC-SLP Watson,Katherine 09/08/2017,2:56 PM

## 2017-09-08 NOTE — ED Notes (Signed)
Dr. York Cerise stated to take pt off of oxygen to see if she could maintain and to inform him if pt reached 88% .Pts O2 sat dropped to 87% on RA, Dr. York Cerise made aware and pts was placed back on 2liters [place]

## 2017-09-08 NOTE — Progress Notes (Signed)
Family Meeting Note  Advance Directive:no  Today a meeting took place with the daughter in law..  Patient is unable to participate due OF:HQRFXJ capacity dementia   The following clinical team members were present during this meeting:MD  The following were discussed:Patient's diagnosis: Old age, dementia, aspiration, Patient's progosis: < 6 months and Goals for treatment: DNR  Additional follow-up to be provided: Meeting with palliative care.  Time spent during discussion:20 minutes  Altamese Dilling, MD

## 2017-09-08 NOTE — ED Notes (Signed)
Pt is resting in bed with eyes closed, chest rising and falling. Family at bedside.

## 2017-09-09 LAB — CALCIUM, IONIZED: CALCIUM, IONIZED, SERUM: 5 mg/dL (ref 4.5–5.6)

## 2017-09-09 LAB — BASIC METABOLIC PANEL
ANION GAP: 10 (ref 5–15)
BUN: 17 mg/dL (ref 8–23)
CHLORIDE: 98 mmol/L (ref 98–111)
CO2: 30 mmol/L (ref 22–32)
Calcium: 8.7 mg/dL — ABNORMAL LOW (ref 8.9–10.3)
Creatinine, Ser: 0.79 mg/dL (ref 0.44–1.00)
Glucose, Bld: 103 mg/dL — ABNORMAL HIGH (ref 70–99)
POTASSIUM: 3.3 mmol/L — AB (ref 3.5–5.1)
Sodium: 138 mmol/L (ref 135–145)

## 2017-09-09 LAB — URINALYSIS, COMPLETE (UACMP) WITH MICROSCOPIC
Bacteria, UA: NONE SEEN
RBC / HPF: >50 RBC/hpf — ABNORMAL HIGH (ref 0–5)
Specific Gravity, Urine: 1.021 (ref 1.005–1.030)

## 2017-09-09 LAB — GLUCOSE, CAPILLARY
GLUCOSE-CAPILLARY: 85 mg/dL (ref 70–99)
Glucose-Capillary: 145 mg/dL — ABNORMAL HIGH (ref 70–99)
Glucose-Capillary: 105 mg/dL — ABNORMAL HIGH (ref 70–99)
Glucose-Capillary: 176 mg/dL — ABNORMAL HIGH (ref 70–99)

## 2017-09-09 LAB — HIV ANTIBODY (ROUTINE TESTING W REFLEX): HIV SCREEN 4TH GENERATION: NONREACTIVE

## 2017-09-09 LAB — PROCALCITONIN: PROCALCITONIN: 0.12 ng/mL

## 2017-09-09 LAB — STREP PNEUMONIAE URINARY ANTIGEN: Strep Pneumo Urinary Antigen: NEGATIVE

## 2017-09-09 MED ORDER — IPRATROPIUM-ALBUTEROL 0.5-2.5 (3) MG/3ML IN SOLN
3.0000 mL | Freq: Two times a day (BID) | RESPIRATORY_TRACT | Status: DC
  Administered 2017-09-10 – 2017-09-11 (×3): 3 mL via RESPIRATORY_TRACT
  Filled 2017-09-09 (×3): qty 3

## 2017-09-09 MED ORDER — POTASSIUM CHLORIDE CRYS ER 20 MEQ PO TBCR
20.0000 meq | EXTENDED_RELEASE_TABLET | Freq: Once | ORAL | Status: AC
  Administered 2017-09-09: 20 meq via ORAL
  Filled 2017-09-09: qty 1

## 2017-09-09 NOTE — Clinical Social Work Note (Addendum)
CSW spoke to [employer] at [employer]," memory care, and informed her that patient's family is requesting hospice to follow her upon discharge.  According to [facility], they can accept patient back with hospice following her.  CSW updated nurse liaison from [place] and [facility].  CSW to continue to follow patient's progress and facilitate discharge planning.  Discharge summary and FL2 needs to be faxed to [phone], and report to be called to [phone].  Ervin Knack. Ladoris Lythgoe, MSW, Theresia Majors [phone]  09/09/2017 12:22 PM

## 2017-09-09 NOTE — Progress Notes (Signed)
[employer] [facility] [place] [facility]   PATIENT NAME: Amanda Mercado    MR#:  [id number]  DATE OF BIRTH:  1921/01/14  SUBJECTIVE:  CHIEF COMPLAINT:   Chief Complaint  Patient presents with  . Shortness of Breath   Sent from her long-term nursing home care with worsening hypoxia. Noted to have aspiration pneumonia on CT scan.  Patient is alert but not oriented, I have cough with rattling sounds.  Her daughter-in-law was present in the room during my visit.  REVIEW OF SYSTEMS:   Patient was pleasantly demented and had no complaints.  ROS  DRUG ALLERGIES:   Allergies  Allergen Reactions  . Doxycycline Calcium Other (See Comments)  . Raloxifene Other (See Comments)  . Rofecoxib Other (See Comments)  . Statins Other (See Comments)    Entire body aches, bones and muscles  . Sulfa Antibiotics Other (See Comments)    VITALS:  Blood pressure 137/66, pulse 79, temperature 98.4 F (36.9 C), temperature source Oral, resp. rate 18, height 5\' 4"  (1.626 m), weight 71.6 kg, SpO2 95 %.  PHYSICAL EXAMINATION:  GENERAL:  84 y.o.-year-old patient lying in the bed with no acute distress.  EYES: Pupils equal, round, reactive to light and accommodation. No scleral icterus. Extraocular muscles intact.  HEENT: Head atraumatic, normocephalic. Oropharynx and nasopharynx clear.  NECK:  Supple, no jugular venous distention. No thyroid enlargement, no tenderness.  LUNGS: Normal breath sounds bilaterally, no wheezing, bilateral crepitation. No use of accessory muscles of respiration.  On supplemental oxygen. CARDIOVASCULAR: S1, S2 normal. No murmurs, rubs, or gallops.  ABDOMEN: Soft, nontender, nondistended. Bowel sounds present. No organomegaly or mass.  EXTREMITIES: No pedal edema, cyanosis, or clubbing.  NEUROLOGIC: Cranial nerves II through XII are intact. Muscle strength 3/5 in all extremities. Sensation intact. Gait not checked.  PSYCHIATRIC: The patient is alert and oriented x  1.  SKIN: No obvious rash, lesion, or ulcer.   Physical Exam LABORATORY PANEL:   CBC Recent Labs  Lab 09/08/17 0230  WBC 14.0*  HGB 9.7*  HCT 28.4*  PLT 267   ------------------------------------------------------------------------------------------------------------------  Chemistries  Recent Labs  Lab 09/08/17 1041 09/09/17 0501  NA  --  138  K  --  3.3*  CL  --  98  CO2  --  30  GLUCOSE  --  103*  BUN  --  17  CREATININE  --  0.79  CALCIUM  --  8.7*  MG 1.8  --   AST 18  --   ALT 12  --   ALKPHOS 73  --   BILITOT 0.8  --    ------------------------------------------------------------------------------------------------------------------  Cardiac Enzymes Recent Labs  Lab 09/08/17 0230 09/08/17 1041  TROPONINI 0.03* <0.03   ------------------------------------------------------------------------------------------------------------------  RADIOLOGY:  Dg Chest 1 View  Result Date: 09/08/2017 CLINICAL DATA:  Shortness of breath and cough.  History of COPD. EXAM: CHEST  1 VIEW COMPARISON:  Chest radiograph June 27, 2017 FINDINGS: Mild cardiomegaly is unchanged. Mildly calcified aortic arch. Similar chronic interstitial changes with chronically elevated RIGHT hemidiaphragm. Unchanged bandlike density RIGHT lung base. No pleural effusion or focal consolidation. No pneumothorax. Soft tissue planes and included osseous structures are non suspicious. IMPRESSION: Similar chronic interstitial changes with RIGHT lung base atelectasis/scarring. Stable cardiomegaly. Aortic Atherosclerosis (ICD10-I70.0). Electronically Signed   By: June 29, 2017 M.D.   On: 09/08/2017 03:29   Ct Chest Wo Contrast  Result Date: 09/08/2017 CLINICAL DATA:  Difficulty breathing, cough.  History of COPD. EXAM: CT CHEST WITHOUT  CONTRAST TECHNIQUE: Multidetector CT imaging of the chest was performed following the standard protocol without IV contrast. COMPARISON:  Chest radiograph September 08, 2017  and CT chest July 18, 2009 FINDINGS: CARDIOVASCULAR: The heart is mildly enlarged. Severe coronary artery calcifications. No pericardial effusion. Thoracic aorta is normal course and caliber, moderate calcific atherosclerosis. Mildly dense intima associated with anemia. Main pulmonary artery is enlarged at 3.5 cm seen with chronic pulmonary arterial hypertension. The heart is mildly enlarged. Moderate coronary artery calcifications. MEDIASTINUM/NODES: Fullness of the hila concerning for component lymphadenopathy. LUNGS/PLEURA: Debris of facing distal RIGHT main stem bronchus with partially aerated distal bronchi. Narrowed bronchi bilaterally, debris within LEFT lobar bronchi. Consolidation RIGHT middle lobe, to lesser extent LEFT lower lobe and lingula. Small RIGHT upper lobe consolidation. RIGHT pleural thickening without pleural effusion. UPPER ABDOMEN: Nonacute. Elevated RIGHT hemidiaphragm. 4.6 cm simple cyst upper pole RIGHT kidney. 2.6 cm cyst LEFT upper pole. MUSCULOSKELETAL: Nonacute.  Multilevel severe spondylosis. IMPRESSION: 1. Debris obstructing RIGHT mainstem bronchus, probable aspiration. Debris obstructing distal LEFT bronchi. 2. Multifocal bibasilar atelectasis and/or pneumonia, most conspicuous in RIGHT middle lobe. 3. Suspected lymphadenopathy, possibly reactive. 4. Cardiomegaly. Aortic Atherosclerosis (ICD10-I70.0). Electronically Signed   By: Awilda Metro M.D.   On: 09/08/2017 06:40    ASSESSMENT AND PLAN:   Active Problems:   Acute hypoxemic respiratory failure (HCC)   Aspiration pneumonia (HCC)   Palliative care by specialist   Goals of care, counseling/discussion  *Aspiration pneumonia Given Vanco and Zosyn on admission, MRSA PCR negative so stopped vancomycin. Soft diet per swallow evaluation, continue on IV fluids. Follow cultures. Due to her old age, dementia, poor baseline status-she has very high risk of worsening condition and if recovers from this episode than high  risk for recurrent admissions. Discussed this with her daughter-in-law was present in the room.  Suggested to have a meeting with palliative care to clarify her goals of treatment.  Pt have some improvement in condition, family has agreed to take her back to facility with hospice care there.  *Hypokalemia Replace IV.  Check magnesium.  *Sinus tachycardia This is in response to infection and some hypoxia, IV fluids and monitor.  Improved.  * hematuria   Hold DVT prophylaxis.   Check for UTI, and send Urine culture.  *Hypertension Continue home medications.  All the records are reviewed and case discussed with Care Management/Social Workerr. Management plans discussed with the patient, family and they are in agreement.  CODE STATUS: DNR  TOTAL TIME TAKING CARE OF THIS PATIENT: 35 minutes.    POSSIBLE D/C IN 1-2 DAYS, DEPENDING ON CLINICAL CONDITION.   Altamese Dilling M.D on 09/09/2017   Between 7am to 6pm - Pager - [phone]  After 6pm go to [email or web address] - password [email or web address]  [facility] [place] [facility]  Office  [phone]  CC: Primary care physician; System, Pcp Not In  Note: This dictation was prepared with Dragon dictation along with smaller phrase technology. Any transcriptional errors that result from this process are unintentional.

## 2017-09-09 NOTE — Consult Note (Signed)
Consultation Note Date: 09/09/2017   Patient Name: Amanda Mercado  DOB: [birth year]  MRN: [id number]  Age / Sex: 84 y.o., female  PCP: System, [place] Not In Referring Physician: Vaughan Basta, *  Reason for Consultation: Establishing goals of care and Hospice Evaluation  HPI/Patient Profile: 84 y.o. female  with past medical history of T2DM, HTN, hypothyroidism, COPD, GERD, and dementia admitted on 09/08/2017 with shortness of breath and cough. Patient diagnosed with aspiration pneumonia. Patient is a ALF resident.  Patient is currently followed by palliative care at the ALF. Patient had swallow evaluation completed 9/3 with no immediate or overt signs of aspiration. Recommended to continue on dys 3 diet with thin liquids. Patient has been to the hospital many times recently for falls, AMS, and dehydration - last admission 6/22-6/23. PMT consulted for [place] and hospice evaluation.  Clinical Assessment and Goals of Care: I have reviewed medical records including EPIC notes, labs and imaging, received report from RN and MD, assessed the patient and then met at the bedside along with patient's daughter, son, and daughter-in-law  to discuss diagnosis prognosis, GOC, EOL wishes, disposition and options.  I introduced Palliative Medicine as specialized medical care for people living with serious illness. It focuses on providing relief from the symptoms and stress of a serious illness. The goal is to improve quality of life for both the patient and the family.  We discussed a brief life review of the patient. They tell me the patient worked as an Agricultural consultant for [employer]. She lived by herself until the past year. At the end of last year, the patient moved in with her daughter after a significant fall with head injury. This lasted about 6 months until a higher level of care was needed and the patient moved into ALF in March.   As far as functional and nutritional  status, family tells me the patient spends most of her time in bed. When she does occasionally get out of bed she is assisted to a wheelchair. They speak of a loss of appetite - describe days where she does not eat at all. Family mentioned weight loss but unsure of amount. Per chart review, patient weighed 171 pounds in June of this year, now weight is 153 pounds. Recent hospital visits for dehydration in June noted. They also speak about her cognitive decline. At times she does not recognize family. They tell me about patient forgetting how to feed herself and requiring assistance. They tell me of frequent, continuous moaning at night.  We discussed her current illness and what it means in the larger context of her on-going co-morbidities. Specifically, we discussed patient's dementia. We also discussed this episode of aspiration pneumonia.  Family tells me their goal is for the patient to be comfortable and have the best quality of life possible. They would like to avoid any further hospitalizations.  Advanced directives, concepts specific to code status, artifical feeding and hydration, and rehospitalization were considered and discussed. Patient is DNR and would not want life artificially prolonged.  Hospice and Palliative Care services outpatient were explained and offered. Patient is currently followed by outpatient palliative. Family is interested in patient receiving hospice care at her ALF.  Questions and concerns were addressed. The family was encouraged to call with questions or concerns.   Primary Decision Maker NEXT OF KIN - son, Edd Arbour and daughter, Stanton Kidney    SUMMARY OF RECOMMENDATIONS   - CSW consult for hospice at ALF - ACP conversation and education about patient's  illness completed  Code Status/Advance Care Planning:  DNR   Symptom Management:  Patient denies currently  Palliative Prophylaxis:   Aspiration, Bowel Regimen, Frequent Pain Assessment, Oral Care and Turn  Reposition  Additional Recommendations (Limitations, Scope, Preferences):  Avoid Hospitalization  Psycho-social/Spiritual:   Desire for further Chaplaincy support:no  Additional Recommendations: Education on Hospice  Prognosis:   Unable to determine - poor prognosis r/t functional, nutritional, and cognitive decline, frequent hospitalizations/ED visits  Discharge Planning: ALF with hospice vs palliative      Primary Diagnoses: Present on Admission: . Acute hypoxemic respiratory failure ([place])   I have reviewed the medical record, interviewed the patient and family, and examined the patient. The following aspects are pertinent.  Past Medical History:  Diagnosis Date  . Arthritis    ra  . COPD (chronic obstructive pulmonary disease) ([place])   . Diabetes mellitus without complication ([place])   . Dizziness    occ  . GERD (gastroesophageal reflux disease)   . HOH (hard of hearing)   . Hypertension   . Hypothyroidism   . Psoriasis   . Rosacea   . Shortness of breath dyspnea    Social History   Socioeconomic History  . Marital status: Widowed    Spouse name: Not on file  . Number of children: Not on file  . Years of education: Not on file  . Highest education level: Not on file  Occupational History  . Not on file  Social Needs  . Financial resource strain: Not on file  . Food insecurity:    Worry: Not on file    Inability: Not on file  . Transportation needs:    Medical: Not on file    Non-medical: Not on file  Tobacco Use  . Smoking status: Former Research scientist (life sciences)  . Smokeless tobacco: Never Used  Substance and Sexual Activity  . Alcohol use: No  . Drug use: Not on file  . Sexual activity: Not on file  Lifestyle  . Physical activity:    Days per week: Not on file    Minutes per session: Not on file  . Stress: Not on file  Relationships  . Social connections:    Talks on phone: Not on file    Gets together: Not on file    Attends religious service: Not on file      Active member of club or organization: Not on file    Attends meetings of clubs or organizations: Not on file    Relationship status: Not on file  Other Topics Concern  . Not on file  Social History Narrative  . Not on file   History reviewed. No pertinent family history. Scheduled Meds: . brimonidine  1 drop Left Eye BID   And  . timolol  1 drop Left Eye BID  . cholecalciferol  4,000 Units Oral Daily  . enoxaparin (LOVENOX) injection  40 mg Subcutaneous Daily  . feeding supplement (ENSURE ENLIVE)  237 mL Oral BID BM  . losartan  100 mg Oral Daily   And  . hydrochlorothiazide  25 mg Oral Daily  . insulin aspart  0-15 Units Subcutaneous TID WC  . insulin aspart  0-5 Units Subcutaneous QHS  . ipratropium-albuterol  3 mL Nebulization Q4H  . levothyroxine  75 mcg Oral QAC breakfast  . loratadine  10 mg Oral Daily  . mouth rinse  15 mL Mouth Rinse BID  . mirtazapine  7.5 mg Oral QHS  . multivitamin with minerals  1 tablet  Oral Daily  . polyethylene glycol  17 g Oral Daily  . sertraline  50 mg Oral Daily  . vitamin B-12  1,000 mcg Oral Daily   Continuous Infusions: . piperacillin-tazobactam (ZOSYN)  IV 3.375 g (09/09/17 0519)   PRN Meds:.hydrocortisone Allergies  Allergen Reactions  . Doxycycline Calcium Other (See Comments)  . Raloxifene Other (See Comments)  . Rofecoxib Other (See Comments)  . Statins Other (See Comments)    Entire body aches, bones and muscles  . Sulfa Antibiotics Other (See Comments)   Review of Systems  Unable to perform ROS: Dementia    Physical Exam  Constitutional: She appears well-developed and well-nourished. She does not appear ill. No distress.  HENT:  Head: Normocephalic and atraumatic.  Cardiovascular: Normal rate and regular rhythm.  Pulmonary/Chest: Effort normal. No respiratory distress. She has wheezes.  Abdominal: Soft. Bowel sounds are normal.  Musculoskeletal:       Right lower leg: Normal.       Left lower leg: Normal.   Neurological: She is alert. She is disoriented.  Skin: Skin is warm and dry.    Vital Signs: BP 116/60 (BP Location: Left Arm)   Pulse 64   Temp 97.8 F (36.6 C) (Oral)   Resp 18   Ht '5\' 4"'$  (1.626 m)   Wt 71.6 kg   SpO2 100%   BMI 27.10 kg/m  Pain Scale: 0-10   Pain Score: 0-No pain   SpO2: SpO2: 100 % O2 Device:SpO2: 100 % O2 Flow Rate: .O2 Flow Rate (L/min): 2 L/min  IO: Intake/output summary:   Intake/Output Summary (Last 24 hours) at 09/09/2017 1035 Last data filed at 09/09/2017 1029 Gross per 24 hour  Intake 800.26 ml  Output 2251 ml  Net -1450.74 ml    LBM: Last BM Date: 09/08/17 Baseline Weight: Weight: (!) 164.4 kg Most recent weight: Weight: 71.6 kg     Palliative Assessment/Data: PPS 40%   Flowsheet Rows     Most Recent Value  Intake Tab  Referral Department  Hospitalist  Unit at Time of Referral  Cardiac/Telemetry Unit  Palliative Care Primary Diagnosis  Neurology  Date Notified  09/08/17  Palliative Care Type  New Palliative care  Reason for referral  Clarify Goals of Care  Date of Admission  09/08/17  Date first seen by Palliative Care  09/08/17  # of days Palliative referral response time  0 Day(s)  # of days IP prior to Palliative referral  0  Clinical Assessment  Palliative Performance Scale Score  30%  Psychosocial & Spiritual Assessment  Palliative Care Outcomes      Time Total: 80 minutes Greater than 50%  of this time was spent counseling and coordinating care related to the above assessment and plan.  Juel Burrow, DNP, AGNP-C Palliative Medicine Team [phone] Pager: [phone]

## 2017-09-09 NOTE — Progress Notes (Addendum)
Upon change of shift noticed that patient's urine is cloudy and bloody, Dr. Elisabeth Pigeon notified and received orders for UA. Will collect and send to lab. Will continue to monitor and assess.

## 2017-09-09 NOTE — Progress Notes (Signed)
New referral for hospice of [place] services at [facility] received from [place] following a Palliative Medicine consult. Patient is a 84 year old woman with a past medical history of T2DM, HTN, hypothyroidism, COPD, GERD, and dementia admitted on 09/08/2017 with shortness of breath and cough. She was diagnosed with aspiration pneumonia. She has had 2 admissions in the past 6 months with a  mental and functional decline noted. She is followed by outpatient Palliative at [facility]. Palliative Medicine was consulted for goals of care and met with patient and her family today. They have chosen for her to return with [facility] with the support of hospice services. Writer spoke on the phone with patient's daughter in law [place]. She and patient's son will be at the hospital tomorrow and will plan to meet to discuss hospice services. Patient information faxed to referral.  Flo Shanks RN, BSN, [facility] and [facility] [place], hospital Liaison [phone]

## 2017-09-10 LAB — CBC
HEMATOCRIT: 29.6 % — AB (ref 35.0–47.0)
HEMOGLOBIN: 9.9 g/dL — AB (ref 12.0–16.0)
MCH: 29.3 pg (ref 26.0–34.0)
MCHC: 33.4 g/dL (ref 32.0–36.0)
MCV: 87.6 fL (ref 80.0–100.0)
PLATELETS: 306 10*3/uL (ref 150–440)
RBC: 3.38 MIL/uL — AB (ref 3.80–5.20)
RDW: 13.9 % (ref 11.5–14.5)
WBC: 13.9 10*3/uL — ABNORMAL HIGH (ref 3.6–11.0)

## 2017-09-10 LAB — BASIC METABOLIC PANEL
ANION GAP: 8 (ref 5–15)
BUN: 16 mg/dL (ref 8–23)
CHLORIDE: 100 mmol/L (ref 98–111)
CO2: 30 mmol/L (ref 22–32)
Calcium: 8.8 mg/dL — ABNORMAL LOW (ref 8.9–10.3)
Creatinine, Ser: 0.81 mg/dL (ref 0.44–1.00)
GFR calc Af Amer: >60 mL/min (ref >60)
Glucose, Bld: 103 mg/dL — ABNORMAL HIGH (ref 70–99)
POTASSIUM: 3 mmol/L — AB (ref 3.5–5.1)
SODIUM: 138 mmol/L (ref 135–145)

## 2017-09-10 LAB — GLUCOSE, CAPILLARY
GLUCOSE-CAPILLARY: 107 mg/dL — AB (ref 70–99)
GLUCOSE-CAPILLARY: 130 mg/dL — AB (ref 70–99)
GLUCOSE-CAPILLARY: 143 mg/dL — AB (ref 70–99)
Glucose-Capillary: 118 mg/dL — ABNORMAL HIGH (ref 70–99)

## 2017-09-10 LAB — URINE CULTURE: Culture: NO GROWTH

## 2017-09-10 LAB — PROCALCITONIN: Procalcitonin: <0.1 ng/mL

## 2017-09-10 MED ORDER — POTASSIUM CHLORIDE CRYS ER 20 MEQ PO TBCR
40.0000 meq | EXTENDED_RELEASE_TABLET | Freq: Once | ORAL | Status: AC
  Administered 2017-09-10: 40 meq via ORAL
  Filled 2017-09-10: qty 2

## 2017-09-10 MED ORDER — ADULT MULTIVITAMIN W/MINERALS CH: 1.0000 | ORAL_TABLET | Freq: Every day | ORAL | 0 refills | Status: DC

## 2017-09-10 MED ORDER — AMOXICILLIN-POT CLAVULANATE 875-125 MG PO TABS: 1.0000 | ORAL_TABLET | Freq: Two times a day (BID) | ORAL | 0 refills | Status: AC

## 2017-09-10 MED ORDER — ENSURE ENLIVE PO LIQD: 237.0000 mL | Freq: Two times a day (BID) | ORAL | 12 refills | Status: AC

## 2017-09-10 NOTE — Progress Notes (Addendum)
Pt was in and out cath with the Help of the ICU charge nurse. Pt voided 590 and was bladder scan after and showed 0 amt. Will continue to monitor.  Update 0427: Pt was bladder scan and showed 531.Will continue to monitor.  Update 0448. Pt in and cath amt was at 450. Pt was also complaining of 5 out 10 pain on Left hip pain. Notify prime. Will continue to monitor. Bladder scan showed 50 ml after in and out cath. Will continue to monitor.  Update 0429: Doctor Anne Hahn ordered acetaminophen 650 mg PRN every 6 hours. Will continue to monitor.   Update 0531: Pt BP was at 186/85 MAP 115 and HR of 82. PT has no PRN meds for BP control. Notify prime. Will continue to monitor.  Update 0531: Doctor Anne Hahn ordered Labetalol IV 10 mg every 2 hours as needed. Will continue to monitor.

## 2017-09-10 NOTE — Plan of Care (Signed)
  Problem: Health Behavior/Discharge Planning: Goal: Ability to manage health-related needs will improve Outcome: Progressing Note:  Patient to discharge today to a SNF with hospice care to follow up. Waiting for bed approval in a facility different from the one which she came. Will continue to monitor. Jari Favre [facility]

## 2017-09-10 NOTE — Progress Notes (Signed)
[facility] [place] at [facility]   PATIENT NAME: Amanda Mercado    MR#:  [id number]  DATE OF BIRTH:  1921/05/26  SUBJECTIVE:  CHIEF COMPLAINT:   Chief Complaint  Patient presents with  . Shortness of Breath   Sent from her long-term nursing home care with worsening hypoxia. Noted to have aspiration pneumonia on CT scan.  Patient is alert but not oriented, much comfortable. Still had foley,after removing, still have urine retention.  REVIEW OF SYSTEMS:   Patient was pleasantly demented and had no complaints.  ROS  DRUG ALLERGIES:   Allergies  Allergen Reactions  . Doxycycline Calcium Other (See Comments)  . Raloxifene Other (See Comments)  . Rofecoxib Other (See Comments)  . Statins Other (See Comments)    Entire body aches, bones and muscles  . Sulfa Antibiotics Other (See Comments)    VITALS:  Blood pressure (!) 144/78, pulse 85, temperature 97.8 F (36.6 C), resp. rate 18, height 5\' 4"  (1.626 m), weight 71.4 kg, SpO2 94 %.  PHYSICAL EXAMINATION:  GENERAL:  84 y.o.-year-old patient lying in the bed with no acute distress.  EYES: Pupils equal, round, reactive to light and accommodation. No scleral icterus. Extraocular muscles intact.  HEENT: Head atraumatic, normocephalic. Oropharynx and nasopharynx clear.  NECK:  Supple, no jugular venous distention. No thyroid enlargement, no tenderness.  LUNGS: Normal breath sounds bilaterally, no wheezing, bilateral crepitation. No use of accessory muscles of respiration.  On supplemental oxygen. CARDIOVASCULAR: S1, S2 normal. No murmurs, rubs, or gallops.  ABDOMEN: Soft, nontender, nondistended. Bowel sounds present. No organomegaly or mass.  EXTREMITIES: No pedal edema, cyanosis, or clubbing.  NEUROLOGIC: Cranial nerves II through XII are intact. Muscle strength 3/5 in all extremities. Sensation intact. Gait not checked.  PSYCHIATRIC: The patient is alert and oriented x 1.  SKIN: No obvious rash, lesion, or  ulcer.   Physical Exam LABORATORY PANEL:   CBC Recent Labs  Lab 09/10/17 0427  WBC 13.9*  HGB 9.9*  HCT 29.6*  PLT 306   ------------------------------------------------------------------------------------------------------------------  Chemistries  Recent Labs  Lab 09/08/17 1041  09/10/17 0427  NA  --    < > 138  K  --    < > 3.0*  CL  --    < > 100  CO2  --    < > 30  GLUCOSE  --    < > 103*  BUN  --    < > 16  CREATININE  --    < > 0.81  CALCIUM  --    < > 8.8*  MG 1.8  --   --   AST 18  --   --   ALT 12  --   --   ALKPHOS 73  --   --   BILITOT 0.8  --   --    < > = values in this interval not displayed.   ------------------------------------------------------------------------------------------------------------------  Cardiac Enzymes Recent Labs  Lab 09/08/17 0230 09/08/17 1041  TROPONINI 0.03* <0.03   ------------------------------------------------------------------------------------------------------------------  RADIOLOGY:  No results found.  ASSESSMENT AND PLAN:   Active Problems:   Acute hypoxemic respiratory failure (HCC)   Aspiration pneumonia (HCC)   Palliative care by specialist   Goals of care, counseling/discussion  *Aspiration pneumonia Given Vanco and Zosyn on admission, MRSA PCR negative so stopped vancomycin. Soft diet per swallow evaluation, continue on IV fluids. Follow cultures. Due to her old age, dementia, poor baseline status-she has very high risk of worsening condition  and if recovers from this episode than high risk for recurrent admissions. Discussed this with her daughter-in-law was present in the room.  Suggested to have a meeting with palliative care to clarify her goals of treatment.  Pt have some improvement in condition, family has agreed to take her back to facility with hospice care there. Her assisted living place can not take her back due to urine retention. Family is looking for private pay NH stay with  hospice.  *Hypokalemia Replace IV.  Check magnesium.  *Sinus tachycardia This is in response to infection and some hypoxia, IV fluids and monitor.  Improved.  * hematuria   Hold DVT prophylaxis.   Check for UTI, and send Urine culture.  *Hypertension Continue home medications.  All the records are reviewed and case discussed with Care Management/Social Workerr. Management plans discussed with the patient, family and they are in agreement.  CODE STATUS: DNR  TOTAL TIME TAKING CARE OF THIS PATIENT: 35 minutes.    POSSIBLE D/C IN 1-2 DAYS, DEPENDING ON CLINICAL CONDITION.   Altamese Dilling M.D on 09/10/2017   Between 7am to 6pm - Pager - [phone]  After 6pm go to [email or web address] - password [email or web address]  [facility] [place] [facility]  Office  [phone]  CC: Primary care physician; System, Pcp Not In  Note: This dictation was prepared with Dragon dictation along with smaller phrase technology. Any transcriptional errors that result from this process are unintentional.

## 2017-09-10 NOTE — [place] FL2 (Signed)
[place] MEDICAID FL2 LEVEL OF CARE SCREENING TOOL     IDENTIFICATION  Patient Name: Amanda Mercado Birthdate: September 06, 1921 Sex: female Admission Date (Current Location): 09/08/2017  [place] and [place] Number:  Chiropodist and Address:  [facility], [address], [place], [place] [address]      Provider Number: [id number]  Attending Physician Name and Address:  Altamese Dilling, *  Relative Name and Phone Number:  Pranavi, Aure [phone]  [phone] or Glory Rosebush Daughter [phone] or Leon,Judy Relative [phone]  [phone]     Current Level of Care: Hospital Recommended Level of Care: Skilled Nursing Facility Prior Approval Number:    Date Approved/Denied:   PASRR Number: [id number]  Discharge Plan: SNF    Current Diagnoses: Patient Active Problem List   Diagnosis Date Noted  . Acute hypoxemic respiratory failure (HCC) 09/08/2017  . Aspiration pneumonia (HCC)   . Palliative care by specialist   . Goals of care, counseling/discussion   . AKI (acute kidney injury) (HCC) 06/27/2017    Orientation RESPIRATION BLADDER Height & Weight     Self  Normal Incontinent Weight: 157 lb 4.8 oz (71.4 kg) Height:  5\' 4"  (162.6 cm)  BEHAVIORAL SYMPTOMS/MOOD NEUROLOGICAL BOWEL NUTRITION STATUS      Incontinent Diet(Pureed)  AMBULATORY STATUS COMMUNICATION OF NEEDS Skin   Extensive Assist Verbally Normal                       Personal Care Assistance Level of Assistance  Feeding, Dressing, Bathing Bathing Assistance: Maximum assistance Feeding assistance: Limited assistance Dressing Assistance: Maximum assistance     Functional Limitations Info  Hearing, Sight, Speech Sight Info: Adequate Hearing Info: Adequate Speech Info: Adequate    SPECIAL CARE FACTORS FREQUENCY                       Contractures Contractures Info: Not present    Additional Factors Info  Code Status, Allergies,  Insulin Sliding Scale, Psychotropic Code Status Info: DNR Allergies Info: DOXYCYCLINE CALCIUM, RALOXIFENE, ROFECOXIB, STATINS, SULFA ANTIBIOTICS  Psychotropic Info: mirtazapine (REMERON) tablet 7.5 mg or sertraline (ZOLOFT) tablet 50 mg  Insulin Sliding Scale Info: insulin aspart (novoLOG) injection 0-15 Units 3x a day with meals       Current Medications (09/10/2017):  This is the current hospital active medication list Current Facility-Administered Medications  Medication Dose Route Frequency Provider Last Rate Last Dose  . brimonidine (ALPHAGAN) 0.2 % ophthalmic solution 1 drop  1 drop Left Eye BID 11/10/2017, MD   1 drop at 09/10/17 0929   And  . timolol (TIMOPTIC) 0.5 % ophthalmic solution 1 drop  1 drop Left Eye BID 11/10/17, MD   1 drop at 09/10/17 0929  . cholecalciferol (VITAMIN D) tablet 4,000 Units  4,000 Units Oral Daily 11/10/17, MD   4,000 Units at 09/10/17 0929  . feeding supplement (ENSURE ENLIVE) (ENSURE ENLIVE) liquid 237 mL  237 mL Oral BID BM 11/10/17, MD   237 mL at 09/10/17 1337  . losartan (COZAAR) tablet 100 mg  100 mg Oral Daily 11/10/17, MD   100 mg at 09/10/17 0930   And  . hydrochlorothiazide (HYDRODIURIL) tablet 25 mg  25 mg Oral Daily 11/10/17, MD   25 mg at 09/10/17 0930  . hydrocortisone (ANUSOL-HC) suppository 25 mg  25 mg Rectal BID PRN 11/10/17, MD      . insulin aspart (novoLOG) injection 0-15  Units  0-15 Units Subcutaneous TID WC Barbaraann Rondo, MD   2 Units at 09/10/17 1235  . insulin aspart (novoLOG) injection 0-5 Units  0-5 Units Subcutaneous QHS Sridharan, Prasanna, MD      . ipratropium-albuterol (DUONEB) 0.5-2.5 (3) MG/3ML nebulizer solution 3 mL  3 mL Nebulization BID Barbaraann Rondo, MD   3 mL at 09/10/17 0755  . levothyroxine (SYNTHROID, LEVOTHROID) tablet 75 mcg  75 mcg Oral QAC breakfast Barbaraann Rondo, MD   75 mcg at 09/10/17 0824  . loratadine  (CLARITIN) tablet 10 mg  10 mg Oral Daily Barbaraann Rondo, MD   10 mg at 09/10/17 0930  . MEDLINE mouth rinse  15 mL Mouth Rinse BID Altamese Dilling, MD   15 mL at 09/09/17 0914  . mirtazapine (REMERON) tablet 7.5 mg  7.5 mg Oral QHS Barbaraann Rondo, MD   7.5 mg at 09/09/17 2158  . multivitamin with minerals tablet 1 tablet  1 tablet Oral Daily Altamese Dilling, MD   1 tablet at 09/10/17 0929  . piperacillin-tazobactam (ZOSYN) IVPB 3.375 g  3.375 g Intravenous Q8H Altamese Dilling, MD 12.5 mL/hr at 09/10/17 0525 3.375 g at 09/10/17 0525  . polyethylene glycol (MIRALAX / GLYCOLAX) packet 17 g  17 g Oral Daily Barbaraann Rondo, MD   17 g at 09/10/17 0929  . sertraline (ZOLOFT) tablet 50 mg  50 mg Oral Daily Barbaraann Rondo, MD   50 mg at 09/10/17 0930  . vitamin B-12 (CYANOCOBALAMIN) tablet 1,000 mcg  1,000 mcg Oral Daily Barbaraann Rondo, MD   1,000 mcg at 09/10/17 0930     Discharge Medications: Please see discharge summary for a list of discharge medications.  Relevant Imaging Results:  Relevant Lab Results:   Additional Information SSN [id number]  Darleene Cleaver, [place]

## 2017-09-10 NOTE — Clinical Social Work Note (Addendum)
CSW received a phone call from [place] at [place] at [facility], and she said the DON said they can not accept patient if she needs a foley because there is not a nurse available at all times.  CSW was also informed that [facility] needs to assess patient before they can accept her back.  CSW informed [facility] that patient may be ready for discharge this afternoon per MD, and they will have to come assess her today.  CSW was told yesterday from [facility] the patient can return if she is medically ready.  CSW spoke to patient's daughter in law [name] who was at bedside, and informed her that [facility] has to come assess patient before they will decide if they can accept her with hospice following.  Patient's family is now considering long term care at [facility] and private paying due to patient's deterioration.  CSW explained what the process is for paying privately for SNF, patient's family gave CSW permission to begin bed search in [place].  3:30pm  CSW met with patient, her son Edd Arbour, his wife [name], and Scientist, physiological Ed at [facility].  [facility] feels that patient should go to a SNF instead of returning back to [facility] due to her increased needs in care.  CSW spent time talking with the family regarding different options for SNFs.  Patient's family are going to go to [facility] and decide if they like it or not and then contact CSW.  CSW updated physician and hospice of [place] [facility].  5:15pm CSW was informed by [facility] that family have visited SNF and would like patient to go to SNF.  CSW updated physician via text page, patient can potential discharge to [facility] on Friday if she is medically ready for discharge and orders have been received.  Meinecke Broom. [place], MSW, [place]  09/10/2017 11:41 AM

## 2017-09-10 NOTE — Care Management (Signed)
[place]- was to discharge today but [facility] indicating they will not accept the patient back after all due to having a foley.  Per CSW- family now looking into placement in a skilled facility under private pay with hospice.

## 2017-09-10 NOTE — Progress Notes (Signed)
Bladder scanned patient earlier and received a residual of 502 cc. Informed physician, obtained order for PRN catheterization if unable to void. Originally with the help of [place], [place], I was unable to insert urinary catheter. Then with the assistance of the charge nurse, Serenity, we together were still unable to insert catheter. A third attempt was made by myself, along with Serenity, Melton Alar and [place], [place]. We were once again unable to insert catheter. Charge nurse suggested calling the I.C.U. to see if they had a staff member that was willing to come over and attempt to insert catheter. Called the ICU department and spoke with the charge nurse who stated she was in fact willing to come over as soon as she could to attempt insertion. It is now 6:44P, if she can't make it over prior to shift change, will inform oncoming nurse of inability to insert catheter and suggest they re-attempt to call the I.C.U. for assistance later on in the evening. Patient states she has absolutely no urge to void, even at this time. Will continue to monitor. Jari Favre [facility]

## 2017-09-10 NOTE — Progress Notes (Signed)
Update: Informed by CSW Windell Moulding that patient may not be able to return to [facility] due to increased care needs. Plan is for facility to come assess patient and then a determination will be made. Family may choose to pay privately for SNF placement with hospice services. Eric to advise. Referral notified. Dayna Barker RN, BSN, [facility] [place], hospital Liaison [phone]

## 2017-09-10 NOTE — Progress Notes (Signed)
Follow up visit made to new referral for [facility] services at [facility]. Patient seen sitting up in bed, alert, able to converse some and answer questions. Reported abdominal pain, foley catheter in place, but appeared to be kinked under her covers, straightened out and a good amount of urine returned. Patient able to express some relief. Breakfast tray at bedside, patient appears to have eaten about 50%. Son Christen Bame in as planned,. Writer initiated education regarding hospice services, philosophy and team approach to care with understanding voiced. Writer spoke with CSW Minerva Areola,  attending physician Dr. Elisabeth Pigeon and staff RN Brett Canales regarding patient's foley. Plan is for a trial removal to see if patient is able to void on her own. Updated notes faxed to referral. Plan is for discharge to [facility] today. Thank you. Dayna Barker RN, BSN, [facility] [place], hospital Liaison [phone]

## 2017-09-11 LAB — GLUCOSE, CAPILLARY
GLUCOSE-CAPILLARY: 118 mg/dL — AB (ref 70–99)
Glucose-Capillary: 133 mg/dL — ABNORMAL HIGH (ref 70–99)
Glucose-Capillary: 152 mg/dL — ABNORMAL HIGH (ref 70–99)
Glucose-Capillary: 116 mg/dL — ABNORMAL HIGH (ref 70–99)

## 2017-09-11 LAB — LEGIONELLA PNEUMOPHILA SEROGP 1 UR AG: L. pneumophila Serogp 1 Ur Ag: NEGATIVE

## 2017-09-11 MED ORDER — AMLODIPINE BESYLATE 5 MG PO TABS: 5.0000 mg | ORAL_TABLET | Freq: Every day | ORAL | Status: DC

## 2017-09-11 MED ORDER — ACETAMINOPHEN 325 MG PO TABS
650.0000 mg | ORAL_TABLET | Freq: Four times a day (QID) | ORAL | Status: DC | PRN
  Administered 2017-09-11: 650 mg via ORAL
  Filled 2017-09-11: qty 2

## 2017-09-11 MED ORDER — AMLODIPINE BESYLATE 10 MG PO TABS
10.0000 mg | ORAL_TABLET | Freq: Every day | ORAL | Status: DC
  Administered 2017-09-11: 10 mg via ORAL
  Filled 2017-09-11: qty 1

## 2017-09-11 MED ORDER — AMLODIPINE BESYLATE 10 MG PO TABS: 10.0000 mg | ORAL_TABLET | Freq: Every day | ORAL | 0 refills | Status: DC

## 2017-09-11 MED ORDER — LABETALOL HCL 5 MG/ML IV SOLN
10.0000 mg | INTRAVENOUS | Status: DC | PRN
  Administered 2017-09-11 (×2): 10 mg via INTRAVENOUS
  Filled 2017-09-11 (×2): qty 4

## 2017-09-11 NOTE — Plan of Care (Signed)

## 2017-09-11 NOTE — Discharge Summary (Signed)
[facility] - [place] at [facility]   PATIENT NAME: Amanda Mercado    MR#:  [id number]  DATE OF BIRTH:  03/11/1921  DATE OF ADMISSION:  09/08/2017 ADMITTING PHYSICIAN: Barbaraann Rondo, MD  DATE OF DISCHARGE: 09/11/2017   PRIMARY CARE PHYSICIAN: System, Pcp Not In    ADMISSION DIAGNOSIS:  Shortness of breath [R06.02] HCAP (healthcare-associated pneumonia) [J18.9] Acute respiratory failure with hypoxemia (HCC) [J96.01] Aspiration pneumonia, unspecified aspiration pneumonia type, unspecified laterality, unspecified part of lung (HCC) [J69.0]  DISCHARGE DIAGNOSIS:  Active Problems:   Acute hypoxemic respiratory failure (HCC)   Aspiration pneumonia (HCC)   Palliative care by specialist   Goals of care, counseling/discussion   SECONDARY DIAGNOSIS:   Past Medical History:  Diagnosis Date  . Arthritis    ra  . COPD (chronic obstructive pulmonary disease) (HCC)   . Diabetes mellitus without complication (HCC)   . Dizziness    occ  . GERD (gastroesophageal reflux disease)   . HOH (hard of hearing)   . Hypertension   . Hypothyroidism   . Psoriasis   . Rosacea   . Shortness of breath dyspnea     HOSPITAL COURSE:    *Aspiration pneumonia Given Vanco and Zosyn on admission, MRSA PCR negative so stopped vancomycin. Soft diet per swallow evaluation, continue on IV fluids. Follow cultures. Due to her old age, 84 dementia, poor baseline status-she has very high risk of worsening condition and if recovers from this episode than high risk for recurrent admissions. Discussed this with her daughter-in-law was present in the room.  Suggested to have a meeting with palliative care to clarify her goals of treatment.  Pt have some improvement in condition, family has agreed to take her back to facility with hospice care there. Her assisted living place can not take her back due to urine retention. Family is looking for private pay NH stay with hospice.   Currently son is wishing to take her for rehab at the facility and convert to long term care or hospice in future. She is a poor candidate for rehab.  will discharge today.  *Hypokalemia Replace IV.  Check magnesium.  *Sinus tachycardia This is in response to infection and some hypoxia, IV fluids and monitor.  Improved.  * hematuria   Hold DVT prophylaxis.   Check for UTI, and send Urine culture.    Cultures negative.  *Hypertension Continue home medications.  DISCHARGE CONDITIONS:   Stable.  CONSULTS OBTAINED:  Treatment Team:  Barbaraann Rondo, MD  DRUG ALLERGIES:   Allergies  Allergen Reactions  . Doxycycline Calcium Other (See Comments)  . Raloxifene Other (See Comments)  . Rofecoxib Other (See Comments)  . Statins Other (See Comments)    Entire body aches, bones and muscles  . Sulfa Antibiotics Other (See Comments)    DISCHARGE MEDICATIONS:   Allergies as of 09/11/2017      Reactions   Doxycycline Calcium Other (See Comments)   Raloxifene Other (See Comments)   Rofecoxib Other (See Comments)   Statins Other (See Comments)   Entire body aches, bones and muscles   Sulfa Antibiotics Other (See Comments)      Medication List    STOP taking these medications   traMADol 50 MG tablet Commonly known as:  ULTRAM     TAKE these medications   acetaminophen 500 MG tablet Commonly known as:  TYLENOL Take 500 mg by mouth every 4 (four) hours as needed for mild pain.   albuterol 108 (90 Base) MCG/ACT  inhaler Commonly known as:  PROVENTIL HFA;VENTOLIN HFA Inhale 2 puffs into the lungs every 4 (four) hours as needed for wheezing or shortness of breath.   amLODipine 10 MG tablet Commonly known as:  NORVASC Take 1 tablet (10 mg total) by mouth daily.   amoxicillin-clavulanate 875-125 MG tablet Commonly known as:  AUGMENTIN Take 1 tablet by mouth 2 (two) times daily for 4 days.   COMBIGAN 0.2-0.5 % ophthalmic solution Generic drug:   brimonidine-timolol Place 1 drop into the left eye 2 (two) times daily.   DRAMAMINE 50 MG Chew Generic drug:  dimenhyDRINATE Chew 1 tablet by mouth every 4 (four) hours as needed (for dizziness).   ezetimibe 10 MG tablet Commonly known as:  ZETIA Take 10 mg by mouth daily.   feeding supplement (ENSURE ENLIVE) Liqd Take 237 mLs by mouth 2 (two) times daily between meals.   Ginkgo Biloba 60 MG Caps Take 120 mg by mouth daily.   hydrocortisone 25 MG suppository Commonly known as:  ANUSOL-HC Place 25 mg rectally 2 (two) times daily as needed for hemorrhoids.   levothyroxine 75 MCG tablet Commonly known as:  SYNTHROID, LEVOTHROID Take 75 mcg by mouth daily before breakfast.   loratadine 10 MG tablet Commonly known as:  CLARITIN Take 10 mg by mouth daily.   losartan-hydrochlorothiazide 100-25 MG tablet Commonly known as:  HYZAAR Take 1 tablet by mouth daily.   magnesium oxide 400 MG tablet Commonly known as:  MAG-OX Take 400 mg by mouth daily.   meclizine 25 MG tablet Commonly known as:  ANTIVERT Take 25 mg by mouth every 8 (eight) hours as needed for dizziness.   mirtazapine 7.5 MG tablet Commonly known as:  REMERON Take 7.5 mg by mouth at bedtime.   multivitamin with minerals Tabs tablet Take 1 tablet by mouth daily.   polyethylene glycol packet Commonly known as:  MIRALAX / GLYCOLAX Take 17 g by mouth daily.   potassium chloride 10 MEQ CR capsule Commonly known as:  MICRO-K Take 10 mEq by mouth daily.   sennosides-docusate sodium 8.6-50 MG tablet Commonly known as:  SENOKOT-S Take 2 tablets by mouth daily.   sertraline 50 MG tablet Commonly known as:  ZOLOFT Take 50 mg by mouth daily.   Vitamin B-12 1000 MCG Subl Place 1,000 mcg under the tongue daily.   Vitamin D 2000 units tablet Take 4,000 Units by mouth daily.        DISCHARGE INSTRUCTIONS:    Follow with Palliative care nurse at the facility.  Diet on discharge-   Dysphagia level 3  (Mech soft foods, moistened); thin liquids. General aspiration precautions; Reflux precautions as pt has GERD at baseline  Medication Administration: Whole meds with puree(for safer swallowing)      If you experience worsening of your admission symptoms, develop shortness of breath, life threatening emergency, suicidal or homicidal thoughts you must seek medical attention immediately by calling 911 or calling your MD immediately  if symptoms less severe.  You Must read complete instructions/literature along with all the possible adverse reactions/side effects for all the Medicines you take and that have been prescribed to you. Take any new Medicines after you have completely understood and accept all the possible adverse reactions/side effects.   Please note  You were cared for by a hospitalist during your hospital stay. If you have any questions about your discharge medications or the care you received while you were in the hospital after you are discharged, you can call the unit  and asked to speak with the hospitalist on call if the hospitalist that took care of you is not available. Once you are discharged, your primary care physician will handle any further medical issues. Please note that NO REFILLS for any discharge medications will be authorized once you are discharged, as it is imperative that you return to your primary care physician (or establish a relationship with a primary care physician if you do not have one) for your aftercare needs so that they can reassess your need for medications and monitor your lab values.    Today   CHIEF COMPLAINT:   Chief Complaint  Patient presents with  . Shortness of Breath    HISTORY OF PRESENT ILLNESS:  Jataya Wann  is a 84 y.o. female with a known history of T2NIDDM, HTN, hypothyroidism, COPD, GERD, dementia (AAOx2, person + place) who presents from long-term care facility [facility]) w/ cough/SOB and hypoxemic respiratory failure. Pt  responds to name, and can tell me that she is in [place], but cannot tell me the year or the name of POTUS. She is lethargic, and is unable to provide Hx/ROS. She tells me she feels "okay". Pt's family (son/DPOA and daughter-in-law) are at bedside. They tell me that pt had been having cough for ~1wk. They tell me the facility did not report if cough was productive. Family was also not made aware of any fever. Pt has apparently been receiving nebulizer treatments at the facility to help with the cough, but I am told that the pt won't take the treatment unless somebody quite literally sits next to her on a 1:1 basis and essentially holds the device directly in front of her face for her. Family visited her during the day on Monday 09/02, but felt she was at her baseline with regards to health and mentation, though they observed (and were also notified of) poor PO intake to food and fluids x1-2d. Pt apparently became acutely dyspneic later in the evening, prompting the facility to contact EMS. Pt was reportedly hypoxic w/ SpO2 90% on room air on EMS arrival. Stable SpO2 > 96% on 2L  at the time of my assessment. Pt does not appear to be in distress, but is warm and clammy/diaphoretic. (+) coarse rhonchi diffusely on lung exam. SIRS (+), tachypnea, hypoxia, leukocytosis. Dry CT chest report, "Debris obstructing right mainstem bronchus, probable aspiration. Debris obstructing distal left bronchi. Multifocal bibasilar atelectasis and/or pneumonia, most conspicuous in right middle lobe." Admit for acute hypoxemic respiratory failure, sepsis 2/2 pneumonia (acute bacterial, aspiration +/- HCAP).   VITAL SIGNS:  Blood pressure (!) 194/91, pulse 76, temperature 97.9 F (36.6 C), temperature source Oral, resp. rate 18, height 5\' 4"  (1.626 m), weight 69.5 kg, SpO2 93 %.  I/O:    Intake/Output Summary (Last 24 hours) at 09/11/2017 1148 Last data filed at 09/10/2017 2041 Gross per 24 hour  Intake 320.29 ml  Output  590 ml  Net -269.71 ml    PHYSICAL EXAMINATION:   GENERAL:  84 y.o.-year-old patient lying in the bed with no acute distress.  EYES: Pupils equal, round, reactive to light and accommodation. No scleral icterus. Extraocular muscles intact.  HEENT: Head atraumatic, normocephalic. Oropharynx and nasopharynx clear.  NECK:  Supple, no jugular venous distention. No thyroid enlargement, no tenderness.  LUNGS: Normal breath sounds bilaterally, no wheezing, bilateral crepitation. No use of accessory muscles of respiration.  On supplemental oxygen. CARDIOVASCULAR: S1, S2 normal. No murmurs, rubs, or gallops.  ABDOMEN: Soft, nontender, nondistended. Bowel  sounds present. No organomegaly or mass.  EXTREMITIES: No pedal edema, cyanosis, or clubbing.  NEUROLOGIC: Cranial nerves II through XII are intact. Muscle strength 3/5 in all extremities. Sensation intact. Gait not checked.  PSYCHIATRIC: The patient is alert and oriented x 1.  SKIN: No obvious rash, lesion, or ulcer.   DATA REVIEW:   CBC Recent Labs  Lab 09/10/17 0427  WBC 13.9*  HGB 9.9*  HCT 29.6*  PLT 306    Chemistries  Recent Labs  Lab 09/08/17 1041  09/10/17 0427  NA  --    < > 138  K  --    < > 3.0*  CL  --    < > 100  CO2  --    < > 30  GLUCOSE  --    < > 103*  BUN  --    < > 16  CREATININE  --    < > 0.81  CALCIUM  --    < > 8.8*  MG 1.8  --   --   AST 18  --   --   ALT 12  --   --   ALKPHOS 73  --   --   BILITOT 0.8  --   --    < > = values in this interval not displayed.    Cardiac Enzymes Recent Labs  Lab 09/08/17 1041  TROPONINI <0.03    Microbiology Results  Results for orders placed or performed during the hospital encounter of 09/08/17  CULTURE, BLOOD (ROUTINE X 2) w Reflex to ID Panel     Status: None (Preliminary result)   Collection Time: 09/08/17 10:43 AM  Result Value Ref Range Status   Specimen Description BLOOD  Final   Special Requests NONE  Final   Culture   Final    NO GROWTH 3  DAYS Performed at [facility], [address] [place], [place] [address]    Report Status PENDING  Incomplete  CULTURE, BLOOD (ROUTINE X 2) w Reflex to ID Panel     Status: None (Preliminary result)   Collection Time: 09/08/17 10:54 AM  Result Value Ref Range Status   Specimen Description BLOOD  Final   Special Requests NONE  Final   Culture   Final    NO GROWTH 3 DAYS Performed at [facility], [address] [place], [place] [address]    Report Status PENDING  Incomplete  MRSA PCR Screening     Status: None   Collection Time: 09/08/17 12:12 PM  Result Value Ref Range Status   MRSA by PCR NEGATIVE NEGATIVE Final    Comment:        The GeneXpert MRSA Assay (FDA approved for NASAL specimens only), is one component of a comprehensive MRSA colonization surveillance program. It is not intended to diagnose MRSA infection nor to guide or monitor treatment for MRSA infections. Performed at [facility], [address] [place], [place] [address]   Urine Culture     Status: None   Collection Time: 09/09/17 12:49 PM  Result Value Ref Range Status   Specimen Description   Final    URINE, RANDOM Performed at [facility], [address] [place], [place] [address]    Special Requests   Final    NONE Performed at [facility], [address], [place], [place] [address]    Culture   Final    NO GROWTH Performed at [facility], [address] [place], [place] [address]    Report Status 09/10/2017 FINAL  Final    RADIOLOGY:  No results found.  EKG:   Orders placed or performed during the hospital encounter of 08/28/17  . ED EKG  . ED EKG  . EKG 12-Lead  . EKG 12-Lead      Management plans discussed with the patient, family and they are in agreement.  CODE STATUS: DNR    Code Status Orders  (From admission, onward)         Start     Ordered   09/08/17 0848  Do not attempt resuscitation (DNR)   Continuous    Question Answer Comment  In the event of cardiac or respiratory ARREST Do not call a "code blue"   In the event of cardiac or respiratory ARREST Do not perform Intubation, CPR, defibrillation or ACLS   In the event of cardiac or respiratory ARREST Use medication by any route, position, wound care, and other measures to relive pain and suffering. May use oxygen, suction and manual treatment of airway obstruction as needed for comfort.      09/08/17 0847        Code Status History    Date Active Date Inactive Code Status Order ID Comments User Context   09/08/2017 0302 09/08/2017 0847 DNR [id number]  Loleta Rose, MD ED   06/27/2017 1759 06/28/2017 1634 DNR [id number]  Milagros Loll, MD ED    Advance Directive Documentation     Most Recent Value  Type of Advance Directive  Out of facility DNR (pink MOST or yellow form)  Pre-existing out of facility DNR order (yellow form or pink MOST form)  -  "MOST" Form in Place?  -      TOTAL TIME TAKING CARE OF THIS PATIENT: 40 minutes.    Altamese Dilling M.D on 09/11/2017 at 11:48 AM  Between 7am to 6pm - Pager - [phone]  After 6pm go to [email or web address] - password [email or web address]  [facility]  Office  [phone]  CC: Primary care physician; System, Pcp Not In   Note: This dictation was prepared with Dragon dictation along with smaller phrase technology. Any transcriptional errors that result from this process are unintentional.

## 2017-09-11 NOTE — Discharge Instructions (Signed)
Palliative care nurse to follow at her facility.

## 2017-09-11 NOTE — Progress Notes (Signed)
Visit made. Patient seen sitting up in bed alert. Son at bedside. Per discussion with Mr. Formisano ans CSW Windell Moulding plan is now for patient to discharge today top [facility]. Plan is for short term rehab. This does make patient ineligible for hospice services. Mr. Campoy voiced his understanding. He was agreeable for outpatient Palliative to continue to follow at [facility] Referral updated. Dayna Barker RN, BSN, [facility] of [place], hospital liaison [phone]

## 2017-09-11 NOTE — Clinical Social Work Note (Addendum)
CSW received phone call from [facility] [place] they have received insurance authorization for patient to go to SNF.  CSW updated patient's son, and he was relieved that they approved.  Patient to be d/c'ed today to [facility] [place] room 601.  Patient and family agreeable to plans will transport via ems RN to call report to [phone].  Patient's son was at bedside and is aware that patient will be discharging today.  Windell Moulding, MSW, [facility] [phone]

## 2017-09-11 NOTE — Care Management Important Message (Signed)
Important Message  Patient Details  Name: Amanda Mercado MRN: [id number] Date of Birth: 01/27/1921   Medicare Important Message Given:  Yes    Olegario Messier A [name] 09/11/2017, 12:00 PM

## 2017-09-11 NOTE — Plan of Care (Signed)
  Problem: Activity: Goal: Risk for activity intolerance will decrease 09/11/2017 0415 by Myles Gip, RN Outcome: Progressing 09/11/2017 0410 by Myles Gip, RN Outcome: Progressing   Problem: Coping: Goal: Level of anxiety will decrease Outcome: Progressing   Problem: Safety: Goal: Ability to remain free from injury will improve Outcome: Progressing

## 2017-09-11 NOTE — Progress Notes (Signed)
[facility] is [facility] to [facility] in a stable condition report given to [facility] floor nurse , awaiting pick up by EMS.

## 2017-09-11 NOTE — Progress Notes (Signed)
We will planning to discharge the patient yesterday, back to her facility with hospice services. Patient have urinary retention and required in and out catheters multiple times, her facility reported to the social worker that with that type of care needed they would not be able to accept her back. Family had agreed to option of private pay nursing home placement. [facility] visited [facility] yesterday, and were told from there-not to sign up on hospice at this point and bring her over as short-term rehab.  If she is not able to improve much then they would help convert her to long-term care and hospice involvement in the future.  I spoke to patient's son today morning, to me patient does not seems like able to get rehab, and would definitely need long-term placement. I had recommended him to have involvement of palliative care nurse, if not hospice right now. In future when her conditions gets worse, they should seek help of palliative care nurse to make further arrangements of hospice if they are all in agreement at that time.  I will [facility] today to [facility] with Foley catheter to avoid 3-4 times a day in and out catheterization.

## 2017-09-11 NOTE — Progress Notes (Signed)
Pharmacy Antibiotic Note  Amanda Mercado is a 84 y.o. female admitted on 09/08/2017 with pneumonia.  Pharmacy has been consulted for Vancomycin and Zosyn dosing.  Patient from assisted living facility  Plan: patient received Zosyn 3.375gm x 1 in ER. Zosyn 3.375g IV q8h (4 hour infusion).  Per primary team patient to be discharged to rehab facility and will be converted to Augmentin.  Height: 5\' 4"  (162.6 cm) Weight: 153 lb 3.2 oz (69.5 kg) IBW/kg (Calculated) : 54.7  Temp (24hrs), Avg:97.9 F (36.6 C), Min:97.8 F (36.6 C), Max:97.9 F (36.6 C)  Recent Labs  Lab 09/08/17 0230 09/09/17 0501 09/10/17 0427  WBC 14.0*  --  13.9*  CREATININE 0.83 0.79 0.81  LATICACIDVEN 1.1  --   --     Estimated Creatinine Clearance: 41.5 mL/min (by C-G formula based on SCr of 0.81 mg/dL).    Allergies  Allergen Reactions  . Doxycycline Calcium Other (See Comments)  . Raloxifene Other (See Comments)  . Rofecoxib Other (See Comments)  . Statins Other (See Comments)    Entire body aches, bones and muscles  . Sulfa Antibiotics Other (See Comments)    Antimicrobials this admission: Zosyn 9/3 >>   Vanc 9/3 >> 9/3  Dose adjustments this admission:    Microbiology results: 9/3 BCx: pending NGTD x 3 days 9/4 UCx:  No growth   9/3 Sputum: pending   9/3  MRSA PCR:  negative  Thank you for allowing pharmacy to be a part of this patient's care.  11/3 09/11/2017 12:57 PM

## 2017-09-11 NOTE — Clinical Social Work Placement (Signed)
   CLINICAL SOCIAL WORK PLACEMENT  NOTE  Date:  09/11/2017  Patient Details  Name: Amanda Mercado MRN: [id number] Date of Birth: 12-27-1921  Clinical Social Work is seeking post-discharge placement for this patient at the Skilled  Nursing Facility level of care (*CSW will initial, date and re-position this form in  chart as items are completed):  Yes   Patient/family provided with [place] Clinical Social Work Department's list of facilities offering this level of care within the geographic area requested by the patient (or if unable, by the patient's family).  Yes   Patient/family informed of their freedom to choose among providers that offer the needed level of care, that participate in Medicare, Medicaid or managed care program needed by the patient, have an available bed and are willing to accept the patient.  Yes   Patient/family informed of [facility]'s ownership interest in [facility] and [facility], as well as of the fact that they are under no obligation to receive care at these facilities.  PASRR submitted to EDS on 09/10/17     PASRR number received on 09/10/17     Existing PASRR number confirmed on       FL2 transmitted to all facilities in geographic area requested by pt/family on       FL2 transmitted to all facilities within larger geographic area on       Patient informed that his/her managed care company has contracts with or will negotiate with certain facilities, including the following:        Yes   Patient/family informed of bed offers received.  Patient chooses bed at [facility]     Physician recommends and patient chooses bed at      Patient to be transferred to [facility] on 09/11/17.  Patient to be transferred to facility by [facility]     Patient family notified on 09/11/17 of transfer.  Name of family member notified:  Aurora Mask, patient's son     PHYSICIAN Please sign FL2, Please sign DNR,  Please prepare prescriptions     Additional Comment:    _______________________________________________ Darleene Cleaver, LCSWA 09/11/2017, 3:58 PM

## 2017-09-13 LAB — CULTURE, BLOOD (ROUTINE X 2)
CULTURE: NO GROWTH
Culture: NO GROWTH

## 2017-10-06 ENCOUNTER — Encounter: Payer: Self-pay | Admitting: Urology

## 2017-10-06 ENCOUNTER — Ambulatory Visit (INDEPENDENT_AMBULATORY_CARE_PROVIDER_SITE_OTHER): Payer: Medicare Other | Admitting: Urology

## 2017-10-06 VITALS — BP 91/53 | HR 61 | Ht 60.0 in | Wt 165.0 lb

## 2017-10-06 DIAGNOSIS — R338 Other retention of urine: Secondary | ICD-10-CM

## 2017-10-06 MED ORDER — DIMETHICONE 1.2 % EX GEL: CUTANEOUS | 12 refills | Status: DC

## 2017-10-06 MED ORDER — POLYETHYLENE GLYCOL 3350 17 G PO PACK: 17.0000 g | PACK | Freq: Every day | ORAL | 12 refills | Status: AC

## 2017-10-06 NOTE — Progress Notes (Signed)
10/06/2017 3:43 PM   Sebastian Ache Jun 17, 1921 [id number]  Referring provider: No referring provider defined for this encounter.  No chief complaint on file.   HPI: Patient is a 84 year old Caucasian female who is referred to [place] by [facility] for urinary retention with her daughter, Corrie Dandy.   Her history is given by her daughter as she has severe dementia.     She was admitted to the hospital for respiratory failure due to pneumonia.  On September 3rd, she had no UOP since 0900 and a bladder scan yielded >1400cc.    Her daughter states that she had not noted any issues with urination prior to her hospital admission and she had been her mother's caretaker for several months.  She did note that her mother did complain of some urgency and incontinence for which she wore depends.  She states her mother did not complain of the inability to urinate or suprapubic pain in the past.    She does not have a history of nephrolithiasis, GU surgery or GU trauma.   She is post menopausal.   She admits to constipation.    She is offered water daily.     PMH: Past Medical History:  Diagnosis Date  . Arthritis    ra  . COPD (chronic obstructive pulmonary disease) (HCC)   . Diabetes mellitus without complication (HCC)   . Dizziness    occ  . GERD (gastroesophageal reflux disease)   . HOH (hard of hearing)   . Hypertension   . Hypothyroidism   . Psoriasis   . Rosacea   . Shortness of breath dyspnea     Surgical History: Past Surgical History:  Procedure Laterality Date  . ABDOMINAL HYSTERECTOMY    . ABDOMINAL HYSTERECTOMY    . ANTERIOR VITRECTOMY Left 08/01/2014   Procedure: ANTERIOR VITRECTOMY;  Surgeon: Galen Manila, MD;  Location: [facility] ORS;  Service: Ophthalmology;  Laterality: Left;  . BACK SURGERY    . CATARACT EXTRACTION W/PHACO Right 07/18/2014   Procedure: CATARACT EXTRACTION PHACO AND INTRAOCULAR LENS PLACEMENT (IOC);  Surgeon: Galen Manila, MD;  Location:  [facility] ORS;  Service: Ophthalmology;  Laterality: Right;  [place] 01:18 AP% 26.9 CDE 21.20 fluid pack lot #[id number]  . CATARACT EXTRACTION W/PHACO Left 08/01/2014   Procedure: CATARACT EXTRACTION PHACO AND INTRAOCULAR LENS PLACEMENT (IOC);  Surgeon: Galen Manila, MD;  Location: [facility] ORS;  Service: Ophthalmology;  Laterality: Left;  cassette lot # [id number]   [place] 1:35.6AP  25.3 CDE 24.7    Home Medications:  Allergies as of 10/06/2017      Reactions   Doxycycline Calcium Other (See Comments)   Raloxifene Other (See Comments)   Rofecoxib Other (See Comments)   Statins Other (See Comments)   Entire body aches, bones and muscles   Sulfa Antibiotics Other (See Comments)      Medication List        Accurate as of 10/06/17 11:59 PM. Always use your most recent med list.          acetaminophen 500 MG tablet Commonly known as:  TYLENOL Take 500 mg by mouth every 4 (four) hours as needed for mild pain.   albuterol 108 (90 Base) MCG/ACT inhaler Commonly known as:  PROVENTIL HFA;VENTOLIN HFA Inhale 2 puffs into the lungs every 4 (four) hours as needed for wheezing or shortness of breath.   amLODipine 10 MG tablet Commonly known as:  NORVASC Take 1 tablet (10 mg total) by mouth daily.   COMBIGAN 0.2-0.5 %  ophthalmic solution Generic drug:  brimonidine-timolol Place 1 drop into the left eye 2 (two) times daily.   Dimethicone 1.2 % Gel Apply topically to the underside of the breasts and groin area as needed for yeast   DRAMAMINE 50 MG Chew Generic drug:  dimenhyDRINATE Chew 1 tablet by mouth every 4 (four) hours as needed (for dizziness).   ezetimibe 10 MG tablet Commonly known as:  ZETIA Take 10 mg by mouth daily.   feeding supplement (ENSURE ENLIVE) Liqd Take 237 mLs by mouth 2 (two) times daily between meals.   Ginkgo Biloba 60 MG Caps Take 120 mg by mouth daily.   hydrocortisone 25 MG suppository Commonly known as:  ANUSOL-HC Place 25 mg rectally 2 (two) times daily as  needed for hemorrhoids.   levothyroxine 75 MCG tablet Commonly known as:  SYNTHROID, LEVOTHROID Take 75 mcg by mouth daily before breakfast.   loratadine 10 MG tablet Commonly known as:  CLARITIN Take 10 mg by mouth daily.   losartan-hydrochlorothiazide 100-25 MG tablet Commonly known as:  HYZAAR Take 1 tablet by mouth daily.   magnesium oxide 400 MG tablet Commonly known as:  MAG-OX Take 400 mg by mouth daily.   meclizine 25 MG tablet Commonly known as:  ANTIVERT Take 25 mg by mouth every 8 (eight) hours as needed for dizziness.   mirtazapine 7.5 MG tablet Commonly known as:  REMERON Take 15 mg by mouth at bedtime.   multivitamin with minerals Tabs tablet Take 1 tablet by mouth daily.   polyethylene glycol packet Commonly known as:  MIRALAX / GLYCOLAX Take 17 g by mouth daily.   potassium chloride 10 MEQ CR capsule Commonly known as:  MICRO-K Take 10 mEq by mouth daily.   sennosides-docusate sodium 8.6-50 MG tablet Commonly known as:  SENOKOT-S Take 2 tablets by mouth daily.   sertraline 50 MG tablet Commonly known as:  ZOLOFT Take 50 mg by mouth daily.   Vitamin B-12 1000 MCG Subl Place 1,000 mcg under the tongue daily.   Vitamin D 2000 units tablet Take 4,000 Units by mouth daily.       Allergies:  Allergies  Allergen Reactions  . Doxycycline Calcium Other (See Comments)  . Raloxifene Other (See Comments)  . Rofecoxib Other (See Comments)  . Statins Other (See Comments)    Entire body aches, bones and muscles  . Sulfa Antibiotics Other (See Comments)    Family History: No family history on file.  Social History:  reports that she has quit smoking. She has never used smokeless tobacco. She reports that she does not drink alcohol. Her drug history is not on file.  ROS: UROLOGY Frequent Urination?: No Hard to postpone urination?: Yes Burning/pain with urination?: No Get up at night to urinate?: No Leakage of urine?: Yes Urine stream starts  and stops?: No Trouble starting stream?: No Do you have to strain to urinate?: No Blood in urine?: No Urinary tract infection?: No Sexually transmitted disease?: No Injury to kidneys or bladder?: No Painful intercourse?: No Weak stream?: No Currently pregnant?: No Vaginal bleeding?: No Last menstrual period?: n  Gastrointestinal Nausea?: No Vomiting?: No Indigestion/heartburn?: Yes Diarrhea?: No Constipation?: Yes  Constitutional Fever: No Night sweats?: No Weight loss?: No Fatigue?: Yes  Skin Skin rash/lesions?: Yes Itching?: No  Eyes Blurred vision?: Yes Double vision?: No  Ears/Nose/Throat Sore throat?: No Sinus problems?: No  Hematologic/Lymphatic Swollen glands?: No Easy bruising?: Yes  Cardiovascular Leg swelling?: Yes Chest pain?: No  Respiratory Cough?: Yes Shortness  of breath?: No  Endocrine Excessive thirst?: No  Musculoskeletal Back pain?: Yes Joint pain?: Yes  Neurological Headaches?: No Dizziness?: No  Psychologic Depression?: No Anxiety?: Yes  Physical Exam: BP (!) 91/53 (BP Location: Right Arm, Patient Position: Sitting, Cuff Size: Normal)   Pulse 61   Ht 5' (1.524 m)   Wt 165 lb (74.8 kg) Comment: pt's daughter stated  BMI 32.22 kg/m   Constitutional: Well nourished. Alert and oriented, No acute distress. HEENT: [place] AT, moist mucus membranes. Trachea midline, no masses. Cardiovascular: No clubbing, cyanosis, or edema. Respiratory: Normal respiratory effort, no increased work of breathing. GI: Abdomen is soft, non tender, non distended, no abdominal masses. Liver and spleen not palpable.  No hernias appreciated.  Stool sample for occult testing is not indicated.   GU: No CVA tenderness.  No bladder fullness or masses.   Skin: No rashes, bruises or suspicious lesions. Lymph: No cervical or inguinal adenopathy. Neurologic: Grossly intact, no focal deficits, moving all 4 extremities. Psychiatric: Normal mood and  affect.  Laboratory Data: Lab Results  Component Value Date   WBC 13.9 (H) 09/10/2017   HGB 9.9 (L) 09/10/2017   HCT 29.6 (L) 09/10/2017   MCV 87.6 09/10/2017   PLT 306 09/10/2017    Lab Results  Component Value Date   CREATININE 0.81 09/10/2017    No results found for: PSA  No results found for: TESTOSTERONE  Lab Results  Component Value Date   HGBA1C 5.9 (H) 06/27/2017    No results found for: TSH  No results found for: CHOL, HDL, CHOLHDL, VLDL, LDLCALC  Lab Results  Component Value Date   AST 18 09/08/2017   Lab Results  Component Value Date   ALT 12 09/08/2017   No components found for: ALKALINEPHOPHATASE No components found for: BILIRUBINTOTAL  No results found for: ESTRADIOL  Urinalysis    Component Value Date/Time   COLORURINE RED (A) 09/09/2017 0954   APPEARANCEUR CLOUDY (A) 09/09/2017 0954   LABSPEC 1.021 09/09/2017 0954   PHURINE  09/09/2017 0954    TEST NOT REPORTED DUE TO COLOR INTERFERENCE OF URINE PIGMENT   GLUCOSEU (A) 09/09/2017 0954    TEST NOT REPORTED DUE TO COLOR INTERFERENCE OF URINE PIGMENT   HGBUR (A) 09/09/2017 0954    TEST NOT REPORTED DUE TO COLOR INTERFERENCE OF URINE PIGMENT   BILIRUBINUR (A) 09/09/2017 0954    TEST NOT REPORTED DUE TO COLOR INTERFERENCE OF URINE PIGMENT   KETONESUR (A) 09/09/2017 0954    TEST NOT REPORTED DUE TO COLOR INTERFERENCE OF URINE PIGMENT   PROTEINUR (A) 09/09/2017 0954    TEST NOT REPORTED DUE TO COLOR INTERFERENCE OF URINE PIGMENT   NITRITE (A) 09/09/2017 0954    TEST NOT REPORTED DUE TO COLOR INTERFERENCE OF URINE PIGMENT   LEUKOCYTESUR (A) 09/09/2017 0954    TEST NOT REPORTED DUE TO COLOR INTERFERENCE OF URINE PIGMENT    I have reviewed the labs.  Assessment & Plan:    1. Urinary retention Patient's daughter is a home health nurse and I have given her a 10 cc syringe and she will remove her mother's catheter at 8 AM tomorrow morning and return to our office by 3:00 tomorrow if her mother  has not been able to void or have suprapubic pain  Return for TOV in am .  These notes generated with voice recognition software. I apologize for typographical errors.  Michiel Cowboy, PA-C  [facility] [address] [place], [place]  [address] [phone]  

## 2017-10-07 ENCOUNTER — Ambulatory Visit (INDEPENDENT_AMBULATORY_CARE_PROVIDER_SITE_OTHER): Payer: Medicare Other

## 2017-10-07 DIAGNOSIS — R339 Retention of urine, unspecified: Secondary | ICD-10-CM

## 2017-10-07 LAB — BLADDER SCAN AMB NON-IMAGING: SCAN RESULT: 282

## 2017-10-07 NOTE — Progress Notes (Signed)
Cath Change/ Replacement Patient was cleaned and prepped in a sterile fashion with betadine and 2% lidocaine jelly was instilled into the urethra. A 16 FR foley cath was replaced into the bladder  Urine return was noted and urine was yellow in color. The balloon was filled with 43ml of sterile water. A night bag was attached for drainage. Patient daughter was given proper instruction on catheter care. She was also set up to get her catheter changed in one month at the [facility].   Preformed by: Laurel Dimmer, CMA

## 2017-10-09 ENCOUNTER — Other Ambulatory Visit: Payer: Self-pay

## 2017-10-20 ENCOUNTER — Encounter: Payer: Self-pay | Admitting: Emergency Medicine

## 2017-10-20 ENCOUNTER — Emergency Department: Payer: Medicare Other

## 2017-10-20 ENCOUNTER — Inpatient Hospital Stay
Admission: EM | Admit: 2017-10-20 | Discharge: 2017-10-23 | DRG: 190 | Disposition: A | Discharge status: Skilled Nursing Facility | Payer: Medicare Other | Attending: Internal Medicine | Admitting: Internal Medicine

## 2017-10-20 ENCOUNTER — Other Ambulatory Visit: Payer: Self-pay

## 2017-10-20 DIAGNOSIS — J209 Acute bronchitis, unspecified: Secondary | ICD-10-CM | POA: Diagnosis present

## 2017-10-20 DIAGNOSIS — F0390 Unspecified dementia without behavioral disturbance: Secondary | ICD-10-CM | POA: Diagnosis present

## 2017-10-20 DIAGNOSIS — J9601 Acute respiratory failure with hypoxia: Secondary | ICD-10-CM | POA: Diagnosis present

## 2017-10-20 DIAGNOSIS — K219 Gastro-esophageal reflux disease without esophagitis: Secondary | ICD-10-CM | POA: Diagnosis present

## 2017-10-20 DIAGNOSIS — Z515 Encounter for palliative care: Secondary | ICD-10-CM | POA: Diagnosis not present

## 2017-10-20 DIAGNOSIS — Y95 Nosocomial condition: Secondary | ICD-10-CM | POA: Diagnosis present

## 2017-10-20 DIAGNOSIS — L89159 Pressure ulcer of sacral region, unspecified stage: Secondary | ICD-10-CM | POA: Diagnosis present

## 2017-10-20 DIAGNOSIS — N390 Urinary tract infection, site not specified: Secondary | ICD-10-CM | POA: Diagnosis present

## 2017-10-20 DIAGNOSIS — J440 Chronic obstructive pulmonary disease with acute lower respiratory infection: Principal | ICD-10-CM | POA: Diagnosis present

## 2017-10-20 DIAGNOSIS — E119 Type 2 diabetes mellitus without complications: Secondary | ICD-10-CM | POA: Diagnosis present

## 2017-10-20 DIAGNOSIS — L89212 Pressure ulcer of right hip, stage 2: Secondary | ICD-10-CM | POA: Diagnosis present

## 2017-10-20 DIAGNOSIS — Z66 Do not resuscitate: Secondary | ICD-10-CM | POA: Diagnosis present

## 2017-10-20 DIAGNOSIS — H9190 Unspecified hearing loss, unspecified ear: Secondary | ICD-10-CM | POA: Diagnosis present

## 2017-10-20 DIAGNOSIS — Z9841 Cataract extraction status, right eye: Secondary | ICD-10-CM | POA: Diagnosis not present

## 2017-10-20 DIAGNOSIS — G92 Toxic encephalopathy: Secondary | ICD-10-CM | POA: Diagnosis present

## 2017-10-20 DIAGNOSIS — M069 Rheumatoid arthritis, unspecified: Secondary | ICD-10-CM | POA: Diagnosis present

## 2017-10-20 DIAGNOSIS — L8990 Pressure ulcer of unspecified site, unspecified stage: Secondary | ICD-10-CM

## 2017-10-20 DIAGNOSIS — E039 Hypothyroidism, unspecified: Secondary | ICD-10-CM | POA: Diagnosis present

## 2017-10-20 DIAGNOSIS — E785 Hyperlipidemia, unspecified: Secondary | ICD-10-CM | POA: Diagnosis present

## 2017-10-20 DIAGNOSIS — I10 Essential (primary) hypertension: Secondary | ICD-10-CM | POA: Diagnosis present

## 2017-10-20 DIAGNOSIS — Z7189 Other specified counseling: Secondary | ICD-10-CM | POA: Diagnosis not present

## 2017-10-20 DIAGNOSIS — D539 Nutritional anemia, unspecified: Secondary | ICD-10-CM | POA: Diagnosis present

## 2017-10-20 DIAGNOSIS — R0602 Shortness of breath: Secondary | ICD-10-CM | POA: Diagnosis not present

## 2017-10-20 DIAGNOSIS — Z882 Allergy status to sulfonamides status: Secondary | ICD-10-CM

## 2017-10-20 DIAGNOSIS — Z87891 Personal history of nicotine dependence: Secondary | ICD-10-CM

## 2017-10-20 DIAGNOSIS — J40 Bronchitis, not specified as acute or chronic: Secondary | ICD-10-CM | POA: Diagnosis present

## 2017-10-20 DIAGNOSIS — Z79890 Hormone replacement therapy (postmenopausal): Secondary | ICD-10-CM

## 2017-10-20 DIAGNOSIS — T83518A Infection and inflammatory reaction due to other urinary catheter, initial encounter: Secondary | ICD-10-CM | POA: Diagnosis present

## 2017-10-20 DIAGNOSIS — Z888 Allergy status to other drugs, medicaments and biological substances status: Secondary | ICD-10-CM

## 2017-10-20 DIAGNOSIS — Z90710 Acquired absence of both cervix and uterus: Secondary | ICD-10-CM | POA: Diagnosis not present

## 2017-10-20 DIAGNOSIS — Z881 Allergy status to other antibiotic agents status: Secondary | ICD-10-CM

## 2017-10-20 DIAGNOSIS — D649 Anemia, unspecified: Secondary | ICD-10-CM

## 2017-10-20 DIAGNOSIS — Z961 Presence of intraocular lens: Secondary | ICD-10-CM | POA: Diagnosis present

## 2017-10-20 DIAGNOSIS — G9340 Encephalopathy, unspecified: Secondary | ICD-10-CM

## 2017-10-20 DIAGNOSIS — Z79899 Other long term (current) drug therapy: Secondary | ICD-10-CM

## 2017-10-20 DIAGNOSIS — J189 Pneumonia, unspecified organism: Secondary | ICD-10-CM | POA: Diagnosis present

## 2017-10-20 DIAGNOSIS — Z9842 Cataract extraction status, left eye: Secondary | ICD-10-CM

## 2017-10-20 LAB — BASIC METABOLIC PANEL
ANION GAP: 4 — AB (ref 5–15)
BUN: 23 mg/dL (ref 8–23)
CALCIUM: 8.8 mg/dL — AB (ref 8.9–10.3)
CO2: 28 mmol/L (ref 22–32)
Chloride: 104 mmol/L (ref 98–111)
Creatinine, Ser: 0.76 mg/dL (ref 0.44–1.00)
Glucose, Bld: 134 mg/dL — ABNORMAL HIGH (ref 70–99)
Potassium: 4.4 mmol/L (ref 3.5–5.1)
Sodium: 136 mmol/L (ref 135–145)

## 2017-10-20 LAB — CBC WITH DIFFERENTIAL/PLATELET
ABS IMMATURE GRANULOCYTES: 0.03 10*3/uL (ref 0.00–0.07)
BASOS ABS: 0 10*3/uL (ref 0.0–0.1)
Basophils Relative: 0 %
Eosinophils Absolute: 0.4 10*3/uL (ref 0.0–0.5)
Eosinophils Relative: 4 %
HEMATOCRIT: 28.9 % — AB (ref 36.0–46.0)
Hemoglobin: 8.8 g/dL — ABNORMAL LOW (ref 12.0–15.0)
IMMATURE GRANULOCYTES: 0 %
LYMPHS ABS: 2.9 10*3/uL (ref 0.7–4.0)
Lymphocytes Relative: 30 %
MCH: 28.6 pg (ref 26.0–34.0)
MCHC: 30.4 g/dL (ref 30.0–36.0)
MCV: 93.8 fL (ref 80.0–100.0)
Monocytes Absolute: 0.6 10*3/uL (ref 0.1–1.0)
Monocytes Relative: 6 %
NEUTROS ABS: 5.8 10*3/uL (ref 1.7–7.7)
NEUTROS PCT: 60 %
NRBC: 0 % (ref 0.0–0.2)
PLATELETS: 311 10*3/uL (ref 150–400)
RBC: 3.08 MIL/uL — ABNORMAL LOW (ref 3.87–5.11)
RDW: 15 % (ref 11.5–15.5)
WBC: 9.8 10*3/uL (ref 4.0–10.5)

## 2017-10-20 LAB — URINALYSIS, COMPLETE (UACMP) WITH MICROSCOPIC
Bilirubin Urine: NEGATIVE
Glucose, UA: NEGATIVE mg/dL
Ketones, ur: NEGATIVE mg/dL
Nitrite: NEGATIVE
Protein, ur: 100 mg/dL — AB
Specific Gravity, Urine: 1.016 (ref 1.005–1.030)
WBC, UA: >50 WBC/hpf — ABNORMAL HIGH (ref 0–5)
pH: 5 (ref 5.0–8.0)

## 2017-10-20 LAB — MRSA PCR SCREENING: MRSA by PCR: NEGATIVE

## 2017-10-20 LAB — BRAIN NATRIURETIC PEPTIDE: B NATRIURETIC PEPTIDE 5: 369 pg/mL — AB (ref 0.0–100.0)

## 2017-10-20 LAB — TROPONIN I: Troponin I: <0.03 ng/mL (ref <0.03)

## 2017-10-20 MED ORDER — EZETIMIBE 10 MG PO TABS
10.0000 mg | ORAL_TABLET | Freq: Every day | ORAL | Status: DC
  Administered 2017-10-21 – 2017-10-23 (×3): 10 mg via ORAL
  Filled 2017-10-20 (×3): qty 1

## 2017-10-20 MED ORDER — MECLIZINE HCL 25 MG PO TABS
25.0000 mg | ORAL_TABLET | Freq: Three times a day (TID) | ORAL | Status: DC | PRN
  Filled 2017-10-20: qty 1

## 2017-10-20 MED ORDER — MIRTAZAPINE 15 MG PO TABS
15.0000 mg | ORAL_TABLET | Freq: Every day | ORAL | Status: DC
  Administered 2017-10-20 – 2017-10-22 (×3): 15 mg via ORAL
  Filled 2017-10-20 (×3): qty 1

## 2017-10-20 MED ORDER — BRIMONIDINE TARTRATE 0.2 % OP SOLN
1.0000 [drp] | Freq: Two times a day (BID) | OPHTHALMIC | Status: DC
  Administered 2017-10-21 – 2017-10-23 (×5): 1 [drp] via OPHTHALMIC
  Filled 2017-10-20: qty 5

## 2017-10-20 MED ORDER — SODIUM CHLORIDE 0.9 % IV SOLN
1.0000 g | Freq: Once | INTRAVENOUS | Status: AC
  Administered 2017-10-20: 1 g via INTRAVENOUS
  Filled 2017-10-20: qty 1

## 2017-10-20 MED ORDER — IPRATROPIUM-ALBUTEROL 0.5-2.5 (3) MG/3ML IN SOLN
3.0000 mL | Freq: Once | RESPIRATORY_TRACT | Status: AC
  Administered 2017-10-20: 3 mL via RESPIRATORY_TRACT
  Filled 2017-10-20: qty 3

## 2017-10-20 MED ORDER — VITAMIN D 1000 UNITS PO TABS
4000.0000 [IU] | ORAL_TABLET | Freq: Every day | ORAL | Status: DC
  Administered 2017-10-21 – 2017-10-23 (×3): 4000 [IU] via ORAL
  Filled 2017-10-20 (×3): qty 4

## 2017-10-20 MED ORDER — DOCUSATE SODIUM 100 MG PO CAPS: 100.0000 mg | ORAL_CAPSULE | Freq: Two times a day (BID) | ORAL | Status: DC | PRN

## 2017-10-20 MED ORDER — ACETAMINOPHEN 500 MG PO TABS
500.0000 mg | ORAL_TABLET | ORAL | Status: DC | PRN
  Administered 2017-10-21 – 2017-10-23 (×7): 500 mg via ORAL
  Filled 2017-10-20 (×7): qty 1

## 2017-10-20 MED ORDER — HYDROCORTISONE ACETATE 25 MG RE SUPP
25.0000 mg | Freq: Two times a day (BID) | RECTAL | Status: DC | PRN
  Filled 2017-10-20: qty 1

## 2017-10-20 MED ORDER — VANCOMYCIN HCL IN DEXTROSE 750-5 MG/150ML-% IV SOLN
750.0000 mg | INTRAVENOUS | Status: DC
  Filled 2017-10-20: qty 150

## 2017-10-20 MED ORDER — ENSURE ENLIVE PO LIQD
237.0000 mL | Freq: Two times a day (BID) | ORAL | Status: DC
  Administered 2017-10-21 – 2017-10-22 (×3): 237 mL via ORAL

## 2017-10-20 MED ORDER — LORATADINE 10 MG PO TABS
10.0000 mg | ORAL_TABLET | Freq: Every day | ORAL | Status: DC
  Administered 2017-10-21 – 2017-10-23 (×3): 10 mg via ORAL
  Filled 2017-10-20 (×3): qty 1

## 2017-10-20 MED ORDER — VITAMIN B-12 1000 MCG PO TABS
1000.0000 ug | ORAL_TABLET | Freq: Every day | ORAL | Status: DC
  Administered 2017-10-21 – 2017-10-22 (×2): 1000 ug via ORAL
  Filled 2017-10-20 (×2): qty 1

## 2017-10-20 MED ORDER — ADULT MULTIVITAMIN W/MINERALS CH
1.0000 | ORAL_TABLET | Freq: Every day | ORAL | Status: DC
  Administered 2017-10-21 – 2017-10-22 (×2): 1 via ORAL
  Filled 2017-10-20 (×2): qty 1

## 2017-10-20 MED ORDER — METHYLPREDNISOLONE SODIUM SUCC 125 MG IJ SOLR: 125.0000 mg | Freq: Once | INTRAMUSCULAR | Status: DC

## 2017-10-20 MED ORDER — SERTRALINE HCL 50 MG PO TABS
75.0000 mg | ORAL_TABLET | Freq: Every day | ORAL | Status: DC
  Administered 2017-10-21 – 2017-10-23 (×3): 75 mg via ORAL
  Filled 2017-10-20 (×3): qty 2

## 2017-10-20 MED ORDER — BRIMONIDINE TARTRATE-TIMOLOL 0.2-0.5 % OP SOLN: 1.0000 [drp] | Freq: Two times a day (BID) | OPHTHALMIC | Status: DC

## 2017-10-20 MED ORDER — LEVOTHYROXINE SODIUM 50 MCG PO TABS
125.0000 ug | ORAL_TABLET | Freq: Every day | ORAL | Status: DC
  Administered 2017-10-21 – 2017-10-23 (×3): 125 ug via ORAL
  Filled 2017-10-20 (×3): qty 3

## 2017-10-20 MED ORDER — TIMOLOL MALEATE 0.5 % OP SOLN
1.0000 [drp] | Freq: Two times a day (BID) | OPHTHALMIC | Status: DC
  Administered 2017-10-21 – 2017-10-23 (×5): 1 [drp] via OPHTHALMIC
  Filled 2017-10-20: qty 5

## 2017-10-20 MED ORDER — HEPARIN SODIUM (PORCINE) 5000 UNIT/ML IJ SOLN
5000.0000 [IU] | Freq: Three times a day (TID) | INTRAMUSCULAR | Status: DC
  Administered 2017-10-20 – 2017-10-22 (×6): 5000 [IU] via SUBCUTANEOUS
  Filled 2017-10-20 (×6): qty 1

## 2017-10-20 MED ORDER — VANCOMYCIN HCL IN DEXTROSE 1-5 GM/200ML-% IV SOLN
1000.0000 mg | Freq: Once | INTRAVENOUS | Status: AC
  Administered 2017-10-20: 1000 mg via INTRAVENOUS
  Filled 2017-10-20: qty 200

## 2017-10-20 MED ORDER — SODIUM CHLORIDE 0.9 % IV SOLN
2.0000 g | Freq: Two times a day (BID) | INTRAVENOUS | Status: DC
  Administered 2017-10-21 – 2017-10-22 (×3): 2 g via INTRAVENOUS
  Filled 2017-10-20 (×5): qty 2

## 2017-10-20 MED ORDER — POLYETHYLENE GLYCOL 3350 17 G PO PACK
17.0000 g | PACK | Freq: Every day | ORAL | Status: DC
  Administered 2017-10-21 – 2017-10-22 (×2): 17 g via ORAL
  Filled 2017-10-20 (×2): qty 1

## 2017-10-20 MED ORDER — IOHEXOL 350 MG/ML SOLN
75.0000 mL | Freq: Once | INTRAVENOUS | Status: AC | PRN
  Administered 2017-10-20: 75 mL via INTRAVENOUS

## 2017-10-20 MED ORDER — DIMENHYDRINATE 50 MG PO CHEW: 1.0000 | CHEWABLE_TABLET | ORAL | Status: DC | PRN

## 2017-10-20 MED ORDER — SENNOSIDES-DOCUSATE SODIUM 8.6-50 MG PO TABS
2.0000 | ORAL_TABLET | Freq: Every day | ORAL | Status: DC
  Administered 2017-10-21 – 2017-10-22 (×2): 2 via ORAL
  Filled 2017-10-20 (×3): qty 2

## 2017-10-20 MED ORDER — MAGNESIUM OXIDE 400 (241.3 MG) MG PO TABS
400.0000 mg | ORAL_TABLET | Freq: Every day | ORAL | Status: DC
  Administered 2017-10-21 – 2017-10-23 (×3): 400 mg via ORAL
  Filled 2017-10-20 (×3): qty 1

## 2017-10-20 NOTE — H&P (Signed)
[employer] - [place] [facility]   PATIENT NAME: Amanda Mercado    MR#:  [id number]  DATE OF BIRTH:  1921/12/23  DATE OF ADMISSION:  10/20/2017  PRIMARY CARE PHYSICIAN: System, Pcp Not In   REQUESTING/REFERRING PHYSICIAN: Sharma Covert  CHIEF COMPLAINT:   Chief Complaint  Patient presents with  . Shortness of Breath    HISTORY OF PRESENT ILLNESS: Amanda Mercado  is a 84 y.o. female with a known history of Arthritis, Urinary retention- chronic foley, DM, GERD, Htn, Hypothyroidism, was sent to nursing home few weeks ago after treatment for aspiration pneumonia. Was sent to Home place from there. At baseline, can not get up, uses lift to move to chair. Since yesterday patient has more cough and she is drowsy and have some wheezing.  The palliative care nurse at her home place suggested to take her to emergency room as she may have some infection. She was noted to be hypoxic and blood pressure was borderline. CT scan on the chest reported changes suggestive of bronchitis and some infiltrate on the lungs so she was given to admit to hospitalist team. Her UA is positive but she has chronic indwelling Foley catheter since last admission.  PAST MEDICAL HISTORY:   Past Medical History:  Diagnosis Date  . Arthritis    ra  . COPD (chronic obstructive pulmonary disease) (HCC)   . Diabetes mellitus without complication (HCC)   . Dizziness    occ  . GERD (gastroesophageal reflux disease)   . HOH (hard of hearing)   . Hypertension   . Hypothyroidism   . Psoriasis   . Rosacea   . Shortness of breath dyspnea     PAST SURGICAL HISTORY:  Past Surgical History:  Procedure Laterality Date  . ABDOMINAL HYSTERECTOMY    . ABDOMINAL HYSTERECTOMY    . ANTERIOR VITRECTOMY Left 08/01/2014   Procedure: ANTERIOR VITRECTOMY;  Surgeon: Galen Manila, MD;  Location: [facility] ORS;  Service: Ophthalmology;  Laterality: Left;  . BACK SURGERY    . CATARACT EXTRACTION W/PHACO Right 07/18/2014    Procedure: CATARACT EXTRACTION PHACO AND INTRAOCULAR LENS PLACEMENT (IOC);  Surgeon: Galen Manila, MD;  Location: [facility] ORS;  Service: Ophthalmology;  Laterality: Right;  [place] 01:18 AP% 26.9 CDE 21.20 fluid pack lot #[id number]  . CATARACT EXTRACTION W/PHACO Left 08/01/2014   Procedure: CATARACT EXTRACTION PHACO AND INTRAOCULAR LENS PLACEMENT (IOC);  Surgeon: Galen Manila, MD;  Location: [facility] ORS;  Service: Ophthalmology;  Laterality: Left;  cassette lot # [id number]   [place] 1:35.6AP  25.3 CDE 24.7    SOCIAL HISTORY:  Social History   Tobacco Use  . Smoking status: Former Games developer  . Smokeless tobacco: Never Used  Substance Use Topics  . Alcohol use: No    FAMILY HISTORY: History reviewed. No pertinent family history.  DRUG ALLERGIES:  Allergies  Allergen Reactions  . Doxycycline Calcium Other (See Comments)  . Raloxifene Other (See Comments)  . Rofecoxib Other (See Comments)  . Statins Other (See Comments)    Entire body aches, bones and muscles  . Sulfa Antibiotics Other (See Comments)    REVIEW OF SYSTEMS:   Because of dementia patient cannot give review of system.  MEDICATIONS AT HOME:  Prior to Admission medications   Medication Sig Start Date End Date Taking? Authorizing Provider  acetaminophen (TYLENOL) 500 MG tablet Take 500 mg by mouth every 4 (four) hours as needed for mild pain.    Yes [provider]  albuterol (PROVENTIL) (2.5 MG/3ML) 0.083%  nebulizer solution Take 2.5 mg by nebulization every 6 (six) hours as needed for wheezing or shortness of breath.   Yes [provider]  amLODipine (NORVASC) 10 MG tablet Take 1 tablet (10 mg total) by mouth daily. 09/11/17  Yes Altamese Dilling, MD  brimonidine-timolol (COMBIGAN) 0.2-0.5 % ophthalmic solution Place 1 drop into the left eye 2 (two) times daily.    Yes [provider]  Cholecalciferol (VITAMIN D) 2000 units tablet Take 4,000 Units by mouth daily.   Yes [provider]   Cyanocobalamin (VITAMIN B-12) 1000 MCG SUBL Place 1,000 mcg under the tongue daily.   Yes [provider]  dimenhyDRINATE (DRAMAMINE) 50 MG CHEW Chew 1 tablet by mouth every 4 (four) hours as needed (for dizziness).    Yes [provider]  Dimethicone (MONISTAT COMPLETE CARE) 1.2 % GEL Apply topically to the underside of the breasts and groin area as needed for yeast 10/06/17  Yes McGowan, Carollee Herter A, PA-C  ezetimibe (ZETIA) 10 MG tablet Take 10 mg by mouth daily.   Yes [provider]  feeding supplement, ENSURE ENLIVE, (ENSURE ENLIVE) LIQD Take 237 mLs by mouth 2 (two) times daily between meals. 09/10/17  Yes Altamese Dilling, MD  hydrocortisone (ANUSOL-HC) 25 MG suppository Place 25 mg rectally 2 (two) times daily as needed for hemorrhoids.    Yes [provider]  levothyroxine (SYNTHROID, LEVOTHROID) 125 MCG tablet Take 125 mcg by mouth daily before breakfast.   Yes [provider]  loratadine (CLARITIN) 10 MG tablet Take 10 mg by mouth daily.   Yes [provider]  losartan-hydrochlorothiazide (HYZAAR) 100-25 MG tablet Take 1 tablet by mouth daily.   Yes [provider]  magnesium oxide (MAG-OX) 400 MG tablet Take 400 mg by mouth daily.   Yes [provider]  meclizine (ANTIVERT) 25 MG tablet Take 25 mg by mouth every 8 (eight) hours as needed for dizziness.    Yes [provider]  mirtazapine (REMERON) 7.5 MG tablet Take 15 mg by mouth at bedtime.    Yes [provider]  Multiple Vitamin (MULTIVITAMIN WITH MINERALS) TABS tablet Take 1 tablet by mouth daily. 09/11/17  Yes Altamese Dilling, MD  polyethylene glycol [facility]) packet Take 17 g by mouth daily. 10/06/17  Yes McGowan, Carollee Herter A, PA-C  potassium chloride (MICRO-K) 10 MEQ CR capsule Take 10 mEq by mouth daily.   Yes [provider]  sennosides-docusate sodium (SENOKOT-S) 8.6-50 MG tablet Take 2 tablets by mouth daily.    Yes [provider]  sertraline (ZOLOFT) 50 MG tablet Take 75 mg by mouth daily.    Yes [provider]      PHYSICAL EXAMINATION:   VITAL SIGNS: Blood pressure 132/68, pulse 80, temperature 97.6 F (36.4 C), temperature source Oral, resp. rate 20, height 5' (1.524 m), weight 68 kg, SpO2 93 %.  GENERAL:  84 y.o.-year-old patient lying in the bed with no acute distress.  EYES: Pupils equal, round, reactive to light and accommodation. No scleral icterus. Extraocular muscles intact.  HEENT: Head atraumatic, normocephalic. Oropharynx and nasopharynx clear.  NECK:  Supple, no jugular venous distention. No thyroid enlargement, no tenderness.  LUNGS: Normal breath sounds bilaterally, no wheezing, some bilateral crepitation. No use of accessory muscles of respiration.  CARDIOVASCULAR: S1, S2 normal. No murmurs, rubs, or gallops.  ABDOMEN: Soft, nontender, nondistended. Bowel sounds present. No organomegaly or mass.  Foley catheter in place. EXTREMITIES: Bilateral pedal edema, no cyanosis, or clubbing.  NEUROLOGIC: Cranial  nerves II through XII are intact. Muscle strength 2-3/5 in all extremities. Sensation intact. Gait not checked.  PSYCHIATRIC: The patient is alert and oriented x 0.  SKIN: No obvious rash, lesion, or ulcer.   LABORATORY PANEL:   CBC Recent Labs  Lab 10/20/17 1347  WBC 9.8  HGB 8.8*  HCT 28.9*  PLT 311  MCV 93.8  MCH 28.6  MCHC 30.4  RDW 15.0  LYMPHSABS 2.9  MONOABS 0.6  EOSABS 0.4  BASOSABS 0.0   ------------------------------------------------------------------------------------------------------------------  Chemistries  Recent Labs  Lab 10/20/17 1347  NA 136  K 4.4  CL 104  CO2 28  GLUCOSE 134*  BUN 23  CREATININE 0.76  CALCIUM 8.8*   ------------------------------------------------------------------------------------------------------------------ estimated creatinine clearance is 37.8 mL/min (by C-G formula based on SCr of 0.76  mg/dL). ------------------------------------------------------------------------------------------------------------------ No results for input(s): TSH, T4TOTAL, T3FREE, THYROIDAB in the last 72 hours.  Invalid input(s): FREET3   Coagulation profile No results for input(s): INR, PROTIME in the last 168 hours. ------------------------------------------------------------------------------------------------------------------- No results for input(s): DDIMER in the last 72 hours. -------------------------------------------------------------------------------------------------------------------  Cardiac Enzymes Recent Labs  Lab 10/20/17 1347  TROPONINI <0.03   ------------------------------------------------------------------------------------------------------------------ Invalid input(s): POCBNP  ---------------------------------------------------------------------------------------------------------------  Urinalysis    Component Value Date/Time   COLORURINE AMBER (A) 10/20/2017 1347   APPEARANCEUR TURBID (A) 10/20/2017 1347   LABSPEC 1.016 10/20/2017 1347   PHURINE 5.0 10/20/2017 1347   GLUCOSEU NEGATIVE 10/20/2017 1347   HGBUR SMALL (A) 10/20/2017 1347   BILIRUBINUR NEGATIVE 10/20/2017 1347   KETONESUR NEGATIVE 10/20/2017 1347   PROTEINUR 100 (A) 10/20/2017 1347   NITRITE NEGATIVE 10/20/2017 1347   LEUKOCYTESUR MODERATE (A) 10/20/2017 1347     RADIOLOGY: Dg Chest 2 View  Result Date: 10/20/2017 CLINICAL DATA:  Shortness of breath and hypoxia. EXAM: CHEST - 2 VIEW COMPARISON:  CT 09/08/2017.  Chest x-ray 09/08/2017, 12/27/2005. FINDINGS: Mediastinum and hilar structures normal. Heart size normal. Chronic interstitial changes. Mild bibasilar atelectasis. No pleural effusion or pneumothorax. Thoracic spine scoliosis and degenerative change. IMPRESSION: Chronic interstitial lung disease. Mild bibasilar atelectasis and/or scarring. Electronically Signed   By: Maisie Fus  Register    On: 10/20/2017 14:51   Ct Angio Chest Pe W And/or Wo Contrast  Result Date: 10/20/2017 CLINICAL DATA:  Dyspnea and hypoxia. EXAM: CT ANGIOGRAPHY CHEST WITH CONTRAST TECHNIQUE: Multidetector CT imaging of the chest was performed using the standard protocol during bolus administration of intravenous contrast. Multiplanar CT image reconstructions and MIPs were obtained to evaluate the vascular anatomy. CONTRAST:  61mL OMNIPAQUE IOHEXOL 350 MG/ML SOLN COMPARISON:  CXR 10/20/2017 FINDINGS: Cardiovascular: Satisfactory opacification of the pulmonary arteries to the segmental level. No evidence of pulmonary embolism. Borderline cardiomegaly without pericardial effusion or thickening. Moderate three-vessel coronary arteriosclerosis. Mild dilatation of the main pulmonary artery to 3.8 cm compatible with chronic pulmonary hypertension. Atherosclerosis of the thoracic aortic arch and descending thoracic aorta without aneurysm. Tortuous great vessels without acute abnormality. Mediastinum/Nodes: No thyromegaly or mass. No adenopathy. Trachea and mainstem bronchi are patent though there is slight luminal narrowing of both right and left mainstem bronchi possibly from chondromalacia. Focal Lungs/Pleura: Chronic right hemidiaphragmatic elevation with adjacent atelectasis. Coarsened interstitial lung markings bilaterally. Faint subpleural patchy airspace opacities in both lung apices possibly representing minimal atelectasis or focal areas of infectious or inflammatory change. Diffuse bilateral peribronchial thickening is noted. Upper Abdomen: Partially included simple appearing cysts in the upper polar aspect of both kidneys, the largest on right measuring up to 4.9 cm. Normal adrenal glands. Unremarkable appearance of the  included liver and spleen. Musculoskeletal: Osteoarthritis of the glenohumeral joints bilaterally. Thoracolumbar spondylosis multilevel degenerative disc disease. No aggressive osseous lesions. Review of  the MIP images confirms the above findings. IMPRESSION: 1. Peribronchial thickening with coarsened interstitial lung markings bilaterally compatible with chronic bronchitic change and interstitial lung disease. Right base atelectasis. 2. Faint subpleural areas of ground-glass opacity in both upper lobes may reflect areas of alveolitis/pneumonitis, atelectasis and/or hypoventilatory change among some considerations. 3. Cardiomegaly with coronary arteriosclerosis. 4. Moderate aortic atherosclerosis without aneurysm. 5. Dilatation of the main pulmonary artery compatible with chronic pulmonary hypertension. Aortic Atherosclerosis (ICD10-I70.0). Electronically Signed   By: Tollie Eth M.D.   On: 10/20/2017 15:43    EKG: Orders placed or performed during the hospital encounter of 10/20/17  . EKG 12-Lead  . EKG 12-Lead    IMPRESSION AND PLAN: *Acute respiratory failure with hypoxia This could be due to bronchitis and pneumonia IV Vanco and cefepime for now check for MRSA PCR. She was advised dysphagia level 3 diet last time by swallow technician, I will get swallow evaluation again Currently patient is on room air and oxygen saturation is 98%.  *Pyuria This could be secondary to indwelling catheter, send urine culture but she may not have active UTI.  *Altered mental status As per Family, since yesterday. This could be a combination of her dementia, infection. I will call palliative care consult again.  *Hypertension Will hold hypertensive medication at this time and monitor.  *Hypothyroidism Continue levothyroxine. All the records are reviewed and case discussed with ED provider. Management plans discussed with the patient, family and they are in agreement.  CODE STATUS: DNR Code Status History    Date Active Date Inactive Code Status Order ID Comments User Context   09/08/2017 0847 09/11/2017 2145 DNR [id number]  Barbaraann Rondo, MD Inpatient   09/08/2017 0302 09/08/2017 0847 DNR [id number]   Loleta Rose, MD ED   06/27/2017 1759 06/28/2017 1634 DNR [id number]  Milagros Loll, MD ED    Questions for Most Recent Historical Code Status (Order [id number])    Question Answer Comment   In the event of cardiac or respiratory ARREST Do not call a "code blue"    In the event of cardiac or respiratory ARREST Do not perform Intubation, CPR, defibrillation or ACLS    In the event of cardiac or respiratory ARREST Use medication by any route, position, wound care, and other measures to relive pain and suffering. May use oxygen, suction and manual treatment of airway obstruction as needed for comfort.         Advance Directive Documentation     Most Recent Value  Type of Advance Directive  Out of facility DNR (pink MOST or yellow form)  Pre-existing out of facility DNR order (yellow form or pink MOST form)  Yellow form placed in chart (order not valid for inpatient use)  "MOST" Form in Place?  -     Discussed with patient's daughter, son and daughter-in-law.  TOTAL TIME TAKING CARE OF THIS PATIENT: 45 minutes.    Altamese Dilling M.D on 10/20/2017   Between 7am to 6pm - Pager - [phone]  After 6pm go to [email or web address] - password [email or web address]  [facility]  Office  [phone]  CC: Primary care physician; System, Pcp Not In   Note: This dictation was prepared with Dragon dictation along with smaller phrase technology. Any transcriptional errors that result from this process are unintentional.

## 2017-10-20 NOTE — ED Triage Notes (Signed)
Pt presents to ED via [facility] from [facility] [place] c/o SOB and hypoxia. EMS report intial O2 sat on RA 78%, increased to 89% after x1 duoneb, x1 albuterol neb, and 125mg  Solu-Medrol IV. O2 sat on arrival 95% on RA. Audible wheezing present.

## 2017-10-20 NOTE — Progress Notes (Signed)
Pharmacy Antibiotic Note  Amanda Mercado is a 84 y.o. female admitted on 10/20/2017 with sepsis and possible  pneumonia.  Pharmacy has been consulted for cefepime and vancomycin dosing.  Plan: Cefepime 2 g IV q12h  Vancomycin 750 mg IV q24h to start tomorrow at 1800.  Trough before 4th dose Follow up MRSA PCR to help guide therapy  Ke 0.031, half life 22.4 h, Vd 39 L Goal trough 15-20 mcg/ml  Height: 5' (152.4 cm) Weight: 150 lb (68 kg) IBW/kg (Calculated) : 45.5  Temp (24hrs), Avg:97.6 F (36.4 C), Min:97.6 F (36.4 C), Max:97.6 F (36.4 C)  Recent Labs  Lab 10/20/17 1347  WBC 9.8  CREATININE 0.76    Estimated Creatinine Clearance: 37.8 mL/min (by C-G formula based on SCr of 0.76 mg/dL).    Allergies  Allergen Reactions  . Doxycycline Calcium Other (See Comments)  . Raloxifene Other (See Comments)  . Rofecoxib Other (See Comments)  . Statins Other (See Comments)    Entire body aches, bones and muscles  . Sulfa Antibiotics Other (See Comments)    Antimicrobials this admission: Cefepime/vanc 10/15>>  Dose adjustments this admission:   Microbiology results:   Thank you for allowing pharmacy to be a part of this patient's care.  Marty Heck 10/20/2017 7:50 PM

## 2017-10-20 NOTE — ED Provider Notes (Signed)
[facility] Emergency Department Provider Note  ____________________________________________  Time seen: Approximately 2:17 PM  I have reviewed the triage vital signs and the nursing notes.   HISTORY  Chief Complaint Shortness of Breath  Level 5 caveat:  Portions of the history and physical were unable to be obtained due to dementia   HPI Amanda Mercado is a 84 y.o. female with a history of COPD, diabetes, hypertension, hyperlipidemia, and aspiration pneumonia who presents for evaluation of shortness of breath.  Patient arrives via EMS from home [facility].  Has had a cough for the last few days.  Patient found to be hypoxic with sats on 78% on room air.  She received 2 DuoNeb's and Solu-Medrol in route.  Upon arrival to the emergency room patient is satting 95% on room air.  She is complaining of constant shortness of breath.  She denies chest pain, nausea or vomiting.  She has had no fevers per nursing home.  Past Medical History:  Diagnosis Date  . Arthritis    ra  . COPD (chronic obstructive pulmonary disease) (HCC)   . Diabetes mellitus without complication (HCC)   . Dizziness    occ  . GERD (gastroesophageal reflux disease)   . HOH (hard of hearing)   . Hypertension   . Hypothyroidism   . Psoriasis   . Rosacea   . Shortness of breath dyspnea     Patient Active Problem List   Diagnosis Date Noted  . Acute hypoxemic respiratory failure (HCC) 09/08/2017  . Aspiration pneumonia (HCC)   . Palliative care by specialist   . Goals of care, counseling/discussion   . AKI (acute kidney injury) (HCC) 06/27/2017    Past Surgical History:  Procedure Laterality Date  . ABDOMINAL HYSTERECTOMY    . ABDOMINAL HYSTERECTOMY    . ANTERIOR VITRECTOMY Left 08/01/2014   Procedure: ANTERIOR VITRECTOMY;  Surgeon: Galen Manila, MD;  Location: [facility] ORS;  Service: Ophthalmology;  Laterality: Left;  . BACK SURGERY    . CATARACT EXTRACTION W/PHACO  Right 07/18/2014   Procedure: CATARACT EXTRACTION PHACO AND INTRAOCULAR LENS PLACEMENT (IOC);  Surgeon: Galen Manila, MD;  Location: [facility] ORS;  Service: Ophthalmology;  Laterality: Right;  [place] 01:18 AP% 26.9 CDE 21.20 fluid pack lot #[id number]  . CATARACT EXTRACTION W/PHACO Left 08/01/2014   Procedure: CATARACT EXTRACTION PHACO AND INTRAOCULAR LENS PLACEMENT (IOC);  Surgeon: Galen Manila, MD;  Location: [facility] ORS;  Service: Ophthalmology;  Laterality: Left;  cassette lot # [id number]   [place] 1:35.6AP  25.3 CDE 24.7    Prior to Admission medications   Medication Sig Start Date End Date Taking? Authorizing Provider  acetaminophen (TYLENOL) 500 MG tablet Take 500 mg by mouth every 4 (four) hours as needed for mild pain.    Yes [provider]  albuterol (PROVENTIL) (2.5 MG/3ML) 0.083% nebulizer solution Take 2.5 mg by nebulization every 6 (six) hours as needed for wheezing or shortness of breath.   Yes [provider]  amLODipine (NORVASC) 10 MG tablet Take 1 tablet (10 mg total) by mouth daily. 09/11/17  Yes Altamese Dilling, MD  brimonidine-timolol (COMBIGAN) 0.2-0.5 % ophthalmic solution Place 1 drop into the left eye 2 (two) times daily.    Yes [provider]  Cholecalciferol (VITAMIN D) 2000 units tablet Take 4,000 Units by mouth daily.   Yes [provider]  Cyanocobalamin (VITAMIN B-12) 1000 MCG SUBL Place 1,000 mcg under the tongue daily.   Yes [provider]  dimenhyDRINATE (  DRAMAMINE) 50 MG CHEW Chew 1 tablet by mouth every 4 (four) hours as needed (for dizziness).    Yes [provider]  Dimethicone (MONISTAT COMPLETE CARE) 1.2 % GEL Apply topically to the underside of the breasts and groin area as needed for yeast 10/06/17  Yes McGowan, Carollee Herter A, PA-C  ezetimibe (ZETIA) 10 MG tablet Take 10 mg by mouth daily.   Yes [provider]  feeding supplement, ENSURE ENLIVE, (ENSURE ENLIVE) LIQD Take 237 mLs by mouth 2 (two) times  daily between meals. 09/10/17  Yes Altamese Dilling, MD  hydrocortisone (ANUSOL-HC) 25 MG suppository Place 25 mg rectally 2 (two) times daily as needed for hemorrhoids.    Yes [provider]  levothyroxine (SYNTHROID, LEVOTHROID) 125 MCG tablet Take 125 mcg by mouth daily before breakfast.   Yes [provider]  loratadine (CLARITIN) 10 MG tablet Take 10 mg by mouth daily.   Yes [provider]  losartan-hydrochlorothiazide (HYZAAR) 100-25 MG tablet Take 1 tablet by mouth daily.   Yes [provider]  magnesium oxide (MAG-OX) 400 MG tablet Take 400 mg by mouth daily.   Yes [provider]  meclizine (ANTIVERT) 25 MG tablet Take 25 mg by mouth every 8 (eight) hours as needed for dizziness.    Yes [provider]  mirtazapine (REMERON) 7.5 MG tablet Take 15 mg by mouth at bedtime.    Yes [provider]  Multiple Vitamin (MULTIVITAMIN WITH MINERALS) TABS tablet Take 1 tablet by mouth daily. 09/11/17  Yes Altamese Dilling, MD  polyethylene glycol [facility]) packet Take 17 g by mouth daily. 10/06/17  Yes McGowan, Carollee Herter A, PA-C  potassium chloride (MICRO-K) 10 MEQ CR capsule Take 10 mEq by mouth daily.   Yes [provider]  sennosides-docusate sodium (SENOKOT-S) 8.6-50 MG tablet Take 2 tablets by mouth daily.    Yes [provider]  sertraline (ZOLOFT) 50 MG tablet Take 75 mg by mouth daily.    Yes [provider]    Allergies Doxycycline calcium; Raloxifene; Rofecoxib; Statins; and Sulfa antibiotics  History reviewed. No pertinent family history.  Social History Social History   Tobacco Use  . Smoking status: Former Games developer  . Smokeless tobacco: Never Used  Substance Use Topics  . Alcohol use: No  . Drug use: Not on file    Review of Systems  Constitutional: Negative for fever. Eyes: Negative for visual changes. ENT: Negative for sore throat. Neck: No neck pain  Cardiovascular:  Negative for chest pain. Respiratory: + shortness of breath, cough, wheezing Gastrointestinal: Negative for abdominal pain, vomiting or diarrhea. Genitourinary: Negative for dysuria. Musculoskeletal: Negative for back pain. Skin: Negative for rash. Neurological: Negative for headaches, weakness or numbness. Psych: No SI or HI  ____________________________________________   PHYSICAL EXAM:  VITAL SIGNS: ED Triage Vitals  Enc Vitals Group     BP 10/20/17 1316 (!) 112/55     Pulse Rate 10/20/17 1316 72     Resp 10/20/17 1316 18     Temp 10/20/17 1316 97.6 F (36.4 C)     Temp Source 10/20/17 1316 Oral     SpO2 10/20/17 1316 95 %     Weight 10/20/17 1318 150 lb (68 kg)     Height 10/20/17 1318 5' (1.524 m)     Head Circumference --      Peak Flow --      Pain Score --      Pain Loc --      Pain Edu? --  Excl. in GC? --     Constitutional: Sleeping, arousable to name calling, oriented x 2 HEENT:      Head: Normocephalic and atraumatic.         Eyes: Conjunctivae are normal. Sclera is non-icteric.       Mouth/Throat: Mucous membranes are moist.       Neck: Supple with no signs of meningismus. Cardiovascular: Regular rate and rhythm. No murmurs, gallops, or rubs. 2+ symmetrical distal pulses are present in all extremities. No JVD. Respiratory: Decreased air movement bilaterally with crackles on bilateral bases and expiratory wheezes, satting 95% on room air with normal respiratory effort.  Gastrointestinal: Soft, non tender, and non distended with positive bowel sounds. No rebound or guarding. Musculoskeletal: Nontender with normal range of motion in all extremities. No edema, cyanosis, or erythema of extremities. Neurologic: Normal speech and language. Face is symmetric. Moving all extremities. No gross focal neurologic deficits are appreciated. Skin: Skin is warm, dry and intact. No rash noted. Psychiatric: Mood and affect are normal. Speech and behavior are  normal.  ____________________________________________   LABS (all labs ordered are listed, but only abnormal results are displayed)  Labs Reviewed  CBC WITH DIFFERENTIAL/PLATELET - Abnormal; Notable for the following components:      Result Value   RBC 3.08 (*)    Hemoglobin 8.8 (*)    HCT 28.9 (*)    All other components within normal limits  BASIC METABOLIC PANEL - Abnormal; Notable for the following components:   Glucose, Bld 134 (*)    Calcium 8.8 (*)    Anion gap 4 (*)    All other components within normal limits  BRAIN NATRIURETIC PEPTIDE - Abnormal; Notable for the following components:   B Natriuretic Peptide 369.0 (*)    All other components within normal limits  URINE CULTURE  TROPONIN I  URINALYSIS, COMPLETE (UACMP) WITH MICROSCOPIC   ____________________________________________  EKG  ED ECG REPORT I, Nita Sickle, the attending physician, personally viewed and interpreted this ECG.  Normal sinus rhythm, rate of 75, normal intervals, normal axis, no ST elevations or depressions, Q wave in lead III and V3, diffuse T wave flattening.  No significant changes when compared to prior. ____________________________________________  RADIOLOGY  I have personally reviewed the images performed during this visit and I agree with the Radiologist's read.   Interpretation by Radiologist:  Dg Chest 2 View  Result Date: 10/20/2017 CLINICAL DATA:  Shortness of breath and hypoxia. EXAM: CHEST - 2 VIEW COMPARISON:  CT 09/08/2017.  Chest x-ray 09/08/2017, 12/27/2005. FINDINGS: Mediastinum and hilar structures normal. Heart size normal. Chronic interstitial changes. Mild bibasilar atelectasis. No pleural effusion or pneumothorax. Thoracic spine scoliosis and degenerative change. IMPRESSION: Chronic interstitial lung disease. Mild bibasilar atelectasis and/or scarring. Electronically Signed   By: Maisie Fus  Register   On: 10/20/2017 14:51   Ct Angio Chest Pe W And/or Wo  Contrast  Result Date: 10/20/2017 CLINICAL DATA:  Dyspnea and hypoxia. EXAM: CT ANGIOGRAPHY CHEST WITH CONTRAST TECHNIQUE: Multidetector CT imaging of the chest was performed using the standard protocol during bolus administration of intravenous contrast. Multiplanar CT image reconstructions and MIPs were obtained to evaluate the vascular anatomy. CONTRAST:  22mL OMNIPAQUE IOHEXOL 350 MG/ML SOLN COMPARISON:  CXR 10/20/2017 FINDINGS: Cardiovascular: Satisfactory opacification of the pulmonary arteries to the segmental level. No evidence of pulmonary embolism. Borderline cardiomegaly without pericardial effusion or thickening. Moderate three-vessel coronary arteriosclerosis. Mild dilatation of the main pulmonary artery to 3.8 cm compatible with chronic pulmonary hypertension. Atherosclerosis  of the thoracic aortic arch and descending thoracic aorta without aneurysm. Tortuous great vessels without acute abnormality. Mediastinum/Nodes: No thyromegaly or mass. No adenopathy. Trachea and mainstem bronchi are patent though there is slight luminal narrowing of both right and left mainstem bronchi possibly from chondromalacia. Focal Lungs/Pleura: Chronic right hemidiaphragmatic elevation with adjacent atelectasis. Coarsened interstitial lung markings bilaterally. Faint subpleural patchy airspace opacities in both lung apices possibly representing minimal atelectasis or focal areas of infectious or inflammatory change. Diffuse bilateral peribronchial thickening is noted. Upper Abdomen: Partially included simple appearing cysts in the upper polar aspect of both kidneys, the largest on right measuring up to 4.9 cm. Normal adrenal glands. Unremarkable appearance of the included liver and spleen. Musculoskeletal: Osteoarthritis of the glenohumeral joints bilaterally. Thoracolumbar spondylosis multilevel degenerative disc disease. No aggressive osseous lesions. Review of the MIP images confirms the above findings. IMPRESSION:  1. Peribronchial thickening with coarsened interstitial lung markings bilaterally compatible with chronic bronchitic change and interstitial lung disease. Right base atelectasis. 2. Faint subpleural areas of ground-glass opacity in both upper lobes may reflect areas of alveolitis/pneumonitis, atelectasis and/or hypoventilatory change among some considerations. 3. Cardiomegaly with coronary arteriosclerosis. 4. Moderate aortic atherosclerosis without aneurysm. 5. Dilatation of the main pulmonary artery compatible with chronic pulmonary hypertension. Aortic Atherosclerosis (ICD10-I70.0). Electronically Signed   By: Tollie Eth M.D.   On: 10/20/2017 15:43     ____________________________________________   PROCEDURES  Procedure(s) performed: None Procedures Critical Care performed:  Yes  CRITICAL CARE Performed by: Nita Sickle  ?  Total critical care time: 35 min  Critical care time was exclusive of separately billable procedures and treating other patients.  Critical care was necessary to treat or prevent imminent or life-threatening deterioration.  Critical care was time spent personally by me on the following activities: development of treatment plan with patient and/or surrogate as well as nursing, discussions with consultants, evaluation of patient's response to treatment, examination of patient, obtaining history from patient or surrogate, ordering and performing treatments and interventions, ordering and review of laboratory studies, ordering and review of radiographic studies, pulse oximetry and re-evaluation of patient's condition.  ____________________________________________   INITIAL IMPRESSION / ASSESSMENT AND PLAN / ED COURSE   84 y.o. female with a history of COPD, diabetes, hypertension, hyperlipidemia, and aspiration pneumonia who presents for evaluation of shortness of breath, hypoxia, cough from SNF.  Patient initially hypoxic which resolved after DuoNeb's per EMS.   She has audible wheezing, decreased air movement bilaterally with crackles but normal work of breathing.  EKG showing no acute ischemic changes. Ddx COPD vs bronchitis vs ACS vs PE  Clinical Course as of Oct 20 1605  Tue Oct 20, 2017  1504 Labs showing worsening anemia with hemoglobin of 8.8.  Rectal exam showing brown stool guaiac negative.  Patient has a indwelling Foley catheter, UA is pending to rule out UTI.  Chest x-ray with no evidence of pneumonia, she has normal white count.  She continues to have significant wheezing after 3 DuoNeb's but oxygen has been 100% on room air.  According to son, at baseline patient recognizes him and is able to have a normal conversation. Patient does not recognize her son, is very sleepy but arousable. No signs of stroke   [CV]    Clinical Course User Index [CV] Don Perking, [place], MD   _________________________ 3:56 PM on 10/20/2017 -----------------------------------------  CT negative for PE but does show evidence of groundglass opacity in both upper lobes concerning for pneumonitis versus pneumonia.  With recent  admission will treat for HCAP with cefepime and vancomycin. UA pending.      As part of my medical decision making, I reviewed the following data within the electronic MEDICAL RECORD NUMBER History obtained from family, Nursing notes reviewed and incorporated, Labs reviewed , EKG interpreted , Old EKG reviewed, Old chart reviewed, Radiograph reviewed , Discussed with admitting physician , Notes from prior ED visits and [place] Controlled Substance Database    Pertinent labs & imaging results that were available during my care of the patient were reviewed by me and considered in my medical decision making (see chart for details).    ____________________________________________   FINAL CLINICAL IMPRESSION(S) / ED DIAGNOSES  Final diagnoses:  Encephalopathy acute  HCAP (healthcare-associated pneumonia)  Acute on chronic anemia      NEW  MEDICATIONS STARTED DURING THIS VISIT:  ED Discharge Orders    None       Note:  This document was prepared using Dragon voice recognition software and may include unintentional dictation errors.    Nita Sickle, MD 10/20/17 [phone]

## 2017-10-20 NOTE — ED Notes (Signed)
Patient transported to X-ray 

## 2017-10-21 DIAGNOSIS — L8990 Pressure ulcer of unspecified site, unspecified stage: Secondary | ICD-10-CM

## 2017-10-21 LAB — CBC
HCT: 27.2 % — ABNORMAL LOW (ref 36.0–46.0)
Hemoglobin: 8.2 g/dL — ABNORMAL LOW (ref 12.0–15.0)
MCH: 27.7 pg (ref 26.0–34.0)
MCHC: 30.1 g/dL (ref 30.0–36.0)
MCV: 91.9 fL (ref 80.0–100.0)
Platelets: 304 10*3/uL (ref 150–400)
RBC: 2.96 MIL/uL — ABNORMAL LOW (ref 3.87–5.11)
RDW: 15 % (ref 11.5–15.5)
WBC: 8.3 10*3/uL (ref 4.0–10.5)
nRBC: 0 % (ref 0.0–0.2)

## 2017-10-21 LAB — BASIC METABOLIC PANEL
Anion gap: 7 (ref 5–15)
BUN: 20 mg/dL (ref 8–23)
CALCIUM: 9.1 mg/dL (ref 8.9–10.3)
CO2: 27 mmol/L (ref 22–32)
CREATININE: 0.68 mg/dL (ref 0.44–1.00)
Chloride: 101 mmol/L (ref 98–111)
GFR calc Af Amer: >60 mL/min (ref >60)
GFR calc non Af Amer: >60 mL/min (ref >60)
Glucose, Bld: 122 mg/dL — ABNORMAL HIGH (ref 70–99)
Potassium: 4.3 mmol/L (ref 3.5–5.1)
SODIUM: 135 mmol/L (ref 135–145)

## 2017-10-21 MED ORDER — IPRATROPIUM-ALBUTEROL 0.5-2.5 (3) MG/3ML IN SOLN
3.0000 mL | Freq: Four times a day (QID) | RESPIRATORY_TRACT | Status: DC | PRN
  Administered 2017-10-21 – 2017-10-23 (×5): 3 mL via RESPIRATORY_TRACT
  Filled 2017-10-21 (×5): qty 3

## 2017-10-21 NOTE — Progress Notes (Signed)
Family Meeting Note  Advance Directive:yes  Today a meeting took place with the Daughter.  Patient is unable to participate due OA:CZYSAY capacity Encephalopathic   The following clinical team members were present during this meeting:MD  The following were discussed:Patient's diagnosis: Dismal long-term prognosis, Patient's progosis: Unable to determine and Goals for treatment: DNR  Additional follow-up to be provided: prn  Time spent during discussion:20 minutes  Bertrum Sol, MD

## 2017-10-21 NOTE — Evaluation (Signed)
Clinical/Bedside Swallow Evaluation Patient Details  Name: Amanda Mercado MRN: [id number] Date of Birth: 04-Jun-1921  Today's Date: 10/21/2017 Time: SLP Start Time (ACUTE ONLY): 0950 SLP Stop Time (ACUTE ONLY): 1050 SLP Time Calculation (min) (ACUTE ONLY): 60 min  Past Medical History:  Past Medical History:  Diagnosis Date  . Arthritis    ra  . COPD (chronic obstructive pulmonary disease) (HCC)   . Diabetes mellitus without complication (HCC)   . Dizziness    occ  . GERD (gastroesophageal reflux disease)   . HOH (hard of hearing)   . Hypertension   . Hypothyroidism   . Psoriasis   . Rosacea   . Shortness of breath dyspnea    Past Surgical History:  Past Surgical History:  Procedure Laterality Date  . ABDOMINAL HYSTERECTOMY    . ABDOMINAL HYSTERECTOMY    . ANTERIOR VITRECTOMY Left 08/01/2014   Procedure: ANTERIOR VITRECTOMY;  Surgeon: Galen Manila, MD;  Location: [facility] ORS;  Service: Ophthalmology;  Laterality: Left;  . BACK SURGERY    . CATARACT EXTRACTION W/PHACO Right 07/18/2014   Procedure: CATARACT EXTRACTION PHACO AND INTRAOCULAR LENS PLACEMENT (IOC);  Surgeon: Galen Manila, MD;  Location: [facility] ORS;  Service: Ophthalmology;  Laterality: Right;  [place] 01:18 AP% 26.9 CDE 21.20 fluid pack lot #[id number]  . CATARACT EXTRACTION W/PHACO Left 08/01/2014   Procedure: CATARACT EXTRACTION PHACO AND INTRAOCULAR LENS PLACEMENT (IOC);  Surgeon: Galen Manila, MD;  Location: [facility] ORS;  Service: Ophthalmology;  Laterality: Left;  cassette lot # [id number]   [place] 1:35.6AP  25.3 CDE 24.7   HPI:  Pt is a 84 y.o. female with a known history of T2NIDDM, HTN, hypothyroidism, COPD, GERD, Dementia (AAOx2, person + place) who presents from long-term care facility [facility]) w/ cough/SOB and hypoxemic respiratory failure. Dry CT chest report, "Debris obstructing right mainstem bronchus, probable aspiration. Debris obstructing distal left bronchi. Multifocal bibasilar atelectasis and/or  pneumonia, most conspicuous in right middle lobe." Pt does have GERD and has been more sedentary in bed per report. Pt was admitted for Acute respiratory failure with hypoxia. Pt is followed by Palliative/Hospice services at [facility].    Assessment / Plan / Recommendation Clinical Impression  Pt appears to present w/ oropharyngeal phase swallowing difficulties impacted most directly by her declined Pulmonary status w/ increased WOB/SOB w/ any exertion; expiratory wheezes, crackles at baseline and during po trials. Pt also has a baseline of Dementia. Pt exhibted overt s/s of aspiration(delayed coughing) w/ thin liquids and throat clearing x1(delayed) w/ Nectar liquids. Pt's congested coughing appeared fatiguing and pt required a rest break b/t trials intermittently. Oral phase was c/b fairly adequate bolus management and A-P transfer for swallowing; oral clearing achieved b/t trials given time w/ trials. Pt required feeding support d/t overall weakness; hands were min stiff w/ apparent edema. Pt exhibited min confusion in follow through w/ tasks. She required rest breaks w/ education on calming her breathing w/ slower inhalation/exhalation. Chest CT reviewed: Peribronchial thickening with coarsened interstitial lung markings bilaterally compatible with chronic bronchitic change and interstitial lung disease. Right base atelectasis. Pt does have GERD at baseline and ANY Esophageal dysmotility and/or retrograde bolus activity could increase risk for aspiration of Reflux material.  Recommend a dysphagia level 2 w/ MINCED foods moistened well, Nectar consistency liquids. Recommend aspiration precautions; Pills in Puree Crushed as able; Feeding support at meals; frequent rest breaks to lessen WOB/SOB w/ her exertion in tasks.  SLP Visit Diagnosis: Dysphagia, oropharyngeal phase (R13.12)  Aspiration Risk  Mild aspiration risk;Moderate aspiration risk;Risk for inadequate nutrition/hydration(d/t  declined Pulmonary status impacting swallowing)    Diet Recommendation  Dysphagia level 2 (MINCED foods) w/ NECTAR consistency liquids; aspiration precautions; rest breaks during meals to lessen WOB/SOB; GERD precautions d/t Reflux  Medication Administration: Crushed with puree    Other  Recommendations Recommended Consults: (dietician f/u; Palliative Care services ) Oral Care Recommendations: Oral care BID;Staff/trained caregiver to provide oral care Other Recommendations: Order thickener from pharmacy;Prohibited food (jello, ice cream, thin soups);Remove water pitcher;Have oral suction available   Follow up Recommendations Skilled Nursing facility      Frequency and Duration min 3x week  2 weeks       Prognosis Prognosis for Safe Diet Advancement: Guarded(-Fair) Barriers to Reach Goals: Cognitive deficits;Severity of deficits(Pulmonary status baseline)      Swallow Study   General Date of Onset: 10/20/17 HPI: Pt is a 84 y.o. female with a known history of T2NIDDM, HTN, hypothyroidism, COPD, GERD, Dementia (AAOx2, person + place) who presents from long-term care facility [facility]) w/ cough/SOB and hypoxemic respiratory failure. Dry CT chest report, "Debris obstructing right mainstem bronchus, probable aspiration. Debris obstructing distal left bronchi. Multifocal bibasilar atelectasis and/or pneumonia, most conspicuous in right middle lobe." Pt does have GERD and has been more sedentary in bed per report. Pt was admitted for Acute respiratory failure with hypoxia. Pt is followed by Palliative/Hospice services at [facility].  Type of Study: Bedside Swallow Evaluation Previous Swallow Assessment: 09/08/2017 Diet Prior to this Study: Dysphagia 3 (soft);Thin liquids Temperature Spikes Noted: No(wbc 8.3 down from 9.3 at admission) Respiratory Status: Room air History of Recent Intubation: No Behavior/Cognition: Alert;Cooperative;Pleasant  mood;Confused;Distractible;Requires cueing(baseline Dementia) Oral Cavity Assessment: Dry Oral Care Completed by SLP: Recent completion by staff Oral Cavity - Dentition: Adequate natural dentition Vision: Functional for self-feeding(appeared) Self-Feeding Abilities: Needs assist;Needs set up;Total assist Patient Positioning: Upright in bed Baseline Vocal Quality: Normal(though WOB/SOB) Volitional Cough: Strong;Congested Volitional Swallow: Able to elicit    Oral/Motor/Sensory Function Overall Oral Motor/Sensory Function: Within functional limits(appeared grossly wfl)   Ice Chips Ice chips: Within functional limits Presentation: Spoon(fed; 3 trials) Other Comments: increased WOB/SOB w/ any exertion though; expiratory wheezes, crackles   Thin Liquid Thin Liquid: Impaired Presentation: Self Fed;Cup Oral Phase Impairments: (none) Oral Phase Functional Implications: (none) Pharyngeal  Phase Impairments: Cough - Delayed(x2/2 trials) Other Comments: increased WOB/SOB w/ any exertion though; expiratory wheezes, crackles    Nectar Thick Nectar Thick Liquid: Impaired Presentation: Self Fed;Cup;Straw Oral Phase Impairments: (none) Oral phase functional implications: (none) Pharyngeal Phase Impairments: Throat Clearing - Delayed(x1/8 trials)   Honey Thick Honey Thick Liquid: Not tested   Puree Puree: Within functional limits Presentation: Spoon(fed; 5 trials) Other Comments: increased WOB/SOB w/ any exertion though; expiratory wheezes, crackles   Solid     Solid: Impaired Presentation: Spoon(fed; 2 trials) Oral Phase Impairments: Impaired mastication(increased time and effort) Oral Phase Functional Implications: Impaired mastication;Prolonged oral transit(increased time and effort) Pharyngeal Phase Impairments: (none) Other Comments: increased WOB/SOB w/ any exertion though; expiratory wheezes, crackles       Jerilynn Som, MS, CCC-SLP Watson,Katherine 10/21/2017,3:45  PM

## 2017-10-21 NOTE — Progress Notes (Signed)
Initial Nutrition Assessment  DOCUMENTATION CODES:   Not applicable  INTERVENTION:   - Continue MVI with minerals daily  - Continue Ensure Enlive po BID, each supplement provides 350 kcal and 20 grams of protein  NUTRITION DIAGNOSIS:   Increased nutrient needs related to wound healing, chronic illness (COPD) as evidenced by estimated needs.  GOAL:   Patient will meet greater than or equal to 90% of their needs  MONITOR:   PO intake, Supplement acceptance, Labs, Weight trends, Skin  REASON FOR ASSESSMENT:   Consult Assessment of nutrition requirement/status  ASSESSMENT:   84 year old female who presented to the ED on 10/15 from [facility] with SOB and hypoxia. PMH significant for COPD, diabetes, hypertension, hyperlipidemia, aspiration pneumonia, hypothyroidism, urinary retention with chronic foley, GERD, and arthritis.  Spoke with pt's daughter at bedside. Pt sleeping at time of visit and woke occasionally throughout the interview. Pt's lunch tray was delivered during RD visit and pt's daughter to assist pt with eating.  Pt's daughter reports that pt receives 3 meals and 3-4 snacks daily and overall "eats fairly well." Pt's daughter shares that if pt does not want or like a food, she will not eat it. Pt's favorite foods are cottage cheese with fruit and [place] food.  Pt's daughter states that pt occasionally had Boost oral nutrition supplements at previous SNF and believes current SNF also offers these to pt. Pt was able to drink one Ensure Enlive so far today.  Pt's daughter states that pt's weight has remained stable recently and that her UBW as 160 lbs. Per weight history in chart, pt's weight has fluctuated over the past 5 months. Current weight of 150 lbs appears stated rather than measured.  Pt's daughter reports that pt occasionally has issues swallowing and may clear her throat when eating. Pt's daughter reports that she is concerned pt may be having  "silent aspiration" of reflux at night while she sleeps.  Meal Completion: 5%  Medications reviewed and include: vitamin D 4000 units daily, Ensure Enlive BID, levothyroxine 125 mcg daily, magnesium oxide 400 mg daily, Remeron 15 mg daily, MVI with minerals daily, Miralax daily, Senokot-S daily, vitamin B-12 1000 mcg daily, IV antibiotics  Labs reviewed: hemoglobin 8.2 (L), HCT 27.2 (L)  NUTRITION - FOCUSED PHYSICAL EXAM:    Most Recent Value  Orbital Region  Mild depletion  Upper Arm Region  Mild depletion  Thoracic and Lumbar Region  No depletion  Buccal Region  No depletion  Temple Region  Mild depletion  Clavicle Bone Region  Moderate depletion  Clavicle and Acromion Bone Region  Moderate depletion  Scapular Bone Region  Unable to assess  Dorsal Hand  Mild depletion  Patellar Region  No depletion  Anterior Thigh Region  Mild depletion  Posterior Calf Region  Mild depletion  Edema (RD Assessment)  Moderate [BLE]  Hair  Reviewed  Eyes  Reviewed  Mouth  Reviewed  Skin  Reviewed  Nails  Reviewed       Diet Order:   Diet Order            DIET DYS 2 Room service appropriate? Yes with Assist; Fluid consistency: Nectar Thick  Diet effective now              EDUCATION NEEDS:   No education needs have been identified at this time  Skin:  Skin Assessment: Skin Integrity Issues: Stage II: right hip  Last BM:  10/15 - large type 7  Height:  Ht Readings from Last 1 Encounters:  10/20/17 5' (1.524 m)    Weight:   Wt Readings from Last 1 Encounters:  10/20/17 68 kg    Ideal Body Weight:  45.45 kg  BMI:  Body mass index is 29.29 kg/m.  Estimated Nutritional Needs:   Kcal:  1300-1500  Protein:  65-80 grams  Fluid:  1.3-1.5 L    Earma Reading, MS, RD, LDN Inpatient Clinical Dietitian Pager: [phone] Weekend/After Hours: [phone]

## 2017-10-21 NOTE — Progress Notes (Signed)
Please note patient is currently followed by outpatient PALLIATIVE at [facility]. CSW [place] and CMRN Bevelyn Ngo made aware. Dayna Barker RN, BSN, [facility], hospital Liaison [phone]

## 2017-10-21 NOTE — Clinical Social Work Note (Signed)
Clinical Social Work Assessment  Patient Details  Name: Amanda Mercado MRN: [id number] Date of Birth: 1921-04-06  Date of referral:  10/21/17               Reason for consult:  Facility Placement                Permission sought to share information with:    Permission granted to share information::     Name::        Agency::     Relationship::     Contact Information:     Housing/Transportation Living arrangements for the past 2 months:  Assisted Living Facility Source of Information:  Adult Children, Facility Patient Interpreter Needed:  None Criminal Activity/Legal Involvement Pertinent to Current Situation/Hospitalization:  No - Comment as needed Significant Relationships:  Adult Children Lives with:  Facility Resident Do you feel safe going back to the place where you live?  Yes Need for family participation in patient care:  Yes (Comment)  Care giving concerns:  Patient resides at [facility]   Social Worker assessment / plan:  CSW informed patient was from [place] and CSW contacted Amanda Mercado, [employer] account planner and they will take patient back when time. CSW contacted patient's daughter , Amanda Mercado: [phone], and she wishes for patient to return there when time. She had no questions or concerns for CSW at this time.   Employment status:  Retired Health and safety inspector:    PT Recommendations:    Information / Referral to community resources:     Patient/Family's Response to care:  Patient's daughter expressed appreciation for CSW call.  Patient/Family's Understanding of and Emotional Response to Diagnosis, Current Treatment, and Prognosis:  Patient's daughter was pleasant and did not have any questions of CSW at this time.   Emotional Assessment Appearance:  Appears stated age Attitude/Demeanor/Rapport:  (sleeping) Affect (typically observed):  (sleeping) Orientation:  Oriented to Self Alcohol / Substance use:  Not Applicable Psych involvement  (Current and /or in the community):  No (Comment)  Discharge Needs  Concerns to be addressed:  Care Coordination Readmission within the last 30 days:  No Current discharge risk:  None Barriers to Discharge:  No Barriers Identified   York Spaniel, LCSW 10/21/2017, 2:41 PM

## 2017-10-21 NOTE — Consult Note (Signed)
WOC Nurse wound consult note Patient evaluated in [facility].  No family present. Reason for Consult: hip and foot wounds Wound type: Right trochanter is an unstagable pressure injury. The LEFT lateral first metatarsal head had a dried, blackened crust that was easily removed.  This area is consistent with a dried and resolving DTI.  The left heel had a dried segment of skin that likely represented a dried fluid filled bulla, which would be consistent with a healed stage 2 PI. Pressure Injury POA: Yes Measurements: Right trochanter wound measures 0.7 cm x 0.7 cm, is 100% yellow slough.  No surrounding induration, no odor, no drainage.  Plan of care:  Continue the use of a small foam dressing to the area.  Change every 3 days (next change date 10/24/17).  Turn the patient off of the right side to the greatest extent possible.  Left heel, no wound present. Left lateral 1st metatarsal head wound area measures 0.3 cm x 1 cm once the dried crust was removed. Wound bed is 100% pink, clean, no induration, no odor, no drainage. Plan of care for this site is small foam dressing.  Change every 3 days.  Next change date is 10/24/17. Monitor the wound area(s) for worsening of condition such as: Signs/symptoms of infection,  Increase in size,  Development of or worsening of odor, Development of pain, or increased pain at the affected locations.  Notify the medical team if any of these develop.  Thank you for the consult.  Discussed plan of care with the patient and bedside nurse.  WOC nurse will not follow at this time.  Please re-consult the WOC team if needed.  Helmut Muster, RN, MSN, CWOCN, CNS-BC, pager [phone]

## 2017-10-21 NOTE — Progress Notes (Signed)
[facility] [place] [facility]   PATIENT NAME: Amanda Mercado    MR#:  [id number]  DATE OF BIRTH:  Jan 03, 1922  SUBJECTIVE:  CHIEF COMPLAINT:   Chief Complaint  Patient presents with  . Shortness of Breath  Patient is lethargic, daughter at the bedside-the family is in agreement with palliative care services, also in agreement for hospice if eligible, wound care nurse to see for chronic multiple pressure ulcerations on lower extremities/sacral/ischial areas  REVIEW OF SYSTEMS:  CONSTITUTIONAL: No fever, fatigue or weakness.  EYES: No blurred or double vision.  EARS, NOSE, AND THROAT: No tinnitus or ear pain.  RESPIRATORY: No cough, shortness of breath, wheezing or hemoptysis.  CARDIOVASCULAR: No chest pain, orthopnea, edema.  GASTROINTESTINAL: No nausea, vomiting, diarrhea or abdominal pain.  GENITOURINARY: No dysuria, hematuria.  ENDOCRINE: No polyuria, nocturia,  HEMATOLOGY: No anemia, easy bruising or bleeding SKIN: No rash or lesion. MUSCULOSKELETAL: No joint pain or arthritis.   NEUROLOGIC: No tingling, numbness, weakness.  PSYCHIATRY: No anxiety or depression.   ROS  DRUG ALLERGIES:   Allergies  Allergen Reactions  . Doxycycline Calcium Other (See Comments)  . Raloxifene Other (See Comments)  . Rofecoxib Other (See Comments)  . Statins Other (See Comments)    Entire body aches, bones and muscles  . Sulfa Antibiotics Other (See Comments)    VITALS:  Blood pressure 122/60, pulse 80, temperature 98.3 F (36.8 C), resp. rate 16, height 5' (1.524 m), weight 68 kg, SpO2 96 %.  PHYSICAL EXAMINATION:  GENERAL:  83 y.o.-year-old patient lying in the bed with no acute distress.  EYES: Pupils equal, round, reactive to light and accommodation. No scleral icterus. Extraocular muscles intact.  HEENT: Head atraumatic, normocephalic. Oropharynx and nasopharynx clear.  NECK:  Supple, no jugular venous distention. No thyroid enlargement, no tenderness.   LUNGS: Normal breath sounds bilaterally, no wheezing, rales,rhonchi or crepitation. No use of accessory muscles of respiration.  CARDIOVASCULAR: S1, S2 normal. No murmurs, rubs, or gallops.  ABDOMEN: Soft, nontender, nondistended. Bowel sounds present. No organomegaly or mass.  EXTREMITIES: No pedal edema, cyanosis, or clubbing.  NEUROLOGIC: Cranial nerves II through XII are intact. Muscle strength 5/5 in all extremities. Sensation intact. Gait not checked.  PSYCHIATRIC: The patient is alert and oriented x 3.  SKIN: No obvious rash, lesion, or ulcer.   Physical Exam LABORATORY PANEL:   CBC Recent Labs  Lab 10/21/17 0546  WBC 8.3  HGB 8.2*  HCT 27.2*  PLT 304   ------------------------------------------------------------------------------------------------------------------  Chemistries  Recent Labs  Lab 10/21/17 0546  NA 135  K 4.3  CL 101  CO2 27  GLUCOSE 122*  BUN 20  CREATININE 0.68  CALCIUM 9.1   ------------------------------------------------------------------------------------------------------------------  Cardiac Enzymes Recent Labs  Lab 10/20/17 1347  TROPONINI <0.03   ------------------------------------------------------------------------------------------------------------------  RADIOLOGY:  Dg Chest 2 View  Result Date: 10/20/2017 CLINICAL DATA:  Shortness of breath and hypoxia. EXAM: CHEST - 2 VIEW COMPARISON:  CT 09/08/2017.  Chest x-ray 09/08/2017, 12/27/2005. FINDINGS: Mediastinum and hilar structures normal. Heart size normal. Chronic interstitial changes. Mild bibasilar atelectasis. No pleural effusion or pneumothorax. Thoracic spine scoliosis and degenerative change. IMPRESSION: Chronic interstitial lung disease. Mild bibasilar atelectasis and/or scarring. Electronically Signed   By: Maisie Fus  Register   On: 10/20/2017 14:51   Ct Angio Chest Pe W And/or Wo Contrast  Result Date: 10/20/2017 CLINICAL DATA:  Dyspnea and hypoxia. EXAM: CT  ANGIOGRAPHY CHEST WITH CONTRAST TECHNIQUE: Multidetector CT imaging of the chest was performed using  the standard protocol during bolus administration of intravenous contrast. Multiplanar CT image reconstructions and MIPs were obtained to evaluate the vascular anatomy. CONTRAST:  40mL OMNIPAQUE IOHEXOL 350 MG/ML SOLN COMPARISON:  CXR 10/20/2017 FINDINGS: Cardiovascular: Satisfactory opacification of the pulmonary arteries to the segmental level. No evidence of pulmonary embolism. Borderline cardiomegaly without pericardial effusion or thickening. Moderate three-vessel coronary arteriosclerosis. Mild dilatation of the main pulmonary artery to 3.8 cm compatible with chronic pulmonary hypertension. Atherosclerosis of the thoracic aortic arch and descending thoracic aorta without aneurysm. Tortuous great vessels without acute abnormality. Mediastinum/Nodes: No thyromegaly or mass. No adenopathy. Trachea and mainstem bronchi are patent though there is slight luminal narrowing of both right and left mainstem bronchi possibly from chondromalacia. Focal Lungs/Pleura: Chronic right hemidiaphragmatic elevation with adjacent atelectasis. Coarsened interstitial lung markings bilaterally. Faint subpleural patchy airspace opacities in both lung apices possibly representing minimal atelectasis or focal areas of infectious or inflammatory change. Diffuse bilateral peribronchial thickening is noted. Upper Abdomen: Partially included simple appearing cysts in the upper polar aspect of both kidneys, the largest on right measuring up to 4.9 cm. Normal adrenal glands. Unremarkable appearance of the included liver and spleen. Musculoskeletal: Osteoarthritis of the glenohumeral joints bilaterally. Thoracolumbar spondylosis multilevel degenerative disc disease. No aggressive osseous lesions. Review of the MIP images confirms the above findings. IMPRESSION: 1. Peribronchial thickening with coarsened interstitial lung markings bilaterally  compatible with chronic bronchitic change and interstitial lung disease. Right base atelectasis. 2. Faint subpleural areas of ground-glass opacity in both upper lobes may reflect areas of alveolitis/pneumonitis, atelectasis and/or hypoventilatory change among some considerations. 3. Cardiomegaly with coronary arteriosclerosis. 4. Moderate aortic atherosclerosis without aneurysm. 5. Dilatation of the main pulmonary artery compatible with chronic pulmonary hypertension. Aortic Atherosclerosis (ICD10-I70.0). Electronically Signed   By: Tollie Eth M.D.   On: 10/20/2017 15:43    ASSESSMENT AND PLAN:  *Acute hypoxic respiratory failure Resolved Suspect due to acute bronchitis versus pneumonia Continue empiric IV cefepime, discontinue IV vancomycin-MRSA PCR negative, continue dysphagia level 3 diet-speech therapy reconsulted  *Acute probable complicated urinary tract infection Cannot exclude possible colonization Noted chronic indwelling Foley catheter for chronic urinary retention Follow-up on urine cultures, continue empiric IV cefepime  *Acute toxic metabolic encephalopathy  Most likely secondary to above  Increase nursing care PRN, aspiration/fall/skin care precautions while in house   *Hypertension Continue to hold hypertensive medications -currently normotensive  *Chronic hypothyroidism, unspecified Stable Continue levothyroxine  Long-term prognosis is dismal, family in agreement for continued palliative care/would want hospice if patient qualifies, no aggressive intervention, disposition pending clinical course  All the records are reviewed and case discussed with Care Management/Social Workerr. Management plans discussed with the patient, family and they are in agreement.  CODE STATUS: DNR  TOTAL TIME TAKING CARE OF THIS PATIENT: 40 minutes.     POSSIBLE D/C IN 1-2 DAYS, DEPENDING ON CLINICAL CONDITION.   Evelena Asa Salary M.D on 10/21/2017   Between 7am to 6pm - Pager -  [phone]  After 6pm go to [email or web address] - password [email or web address]  [facility]  Office  [phone]  CC: Primary care physician; System, Pcp Not In  Note: This dictation was prepared with Dragon dictation along with smaller phrase technology. Any transcriptional errors that result from this process are unintentional.

## 2017-10-21 NOTE — Consult Note (Signed)
WOC Nurse wound consult note Consult order received to "evaluate and treat".  I spoke with the patient's primary RN, Sue Lush, and she is unaware of any wound or skin issues.  I have cancelled the order.  I have requested that if the WOC is needed, to be specific in the consult order for why we are being asked to see the patient. Reason for Consult: unknown Please re-consult the WOC team if needed.  Helmut Muster, RN, MSN, CWOCN, CNS-BC, pager [phone]

## 2017-10-21 NOTE — NC FL2 (Addendum)
[place] MEDICAID FL2 LEVEL OF CARE SCREENING TOOL     IDENTIFICATION  Patient Name: Amanda Mercado Birthdate: Jul 16, 1921 Sex: female Admission Date (Current Location): 10/20/2017  [facility] and [place] Number:  Chiropodist and Address:  [facility], [address] [place], [place] [address]      Provider Number: [id number]  Attending Physician Name and Address:  Bertrum Sol, MD  Relative Name and Phone Number:       Current Level of Care: Hospital Recommended Level of Care: Assisted Living Facility, Memory Care Prior Approval Number:    Date Approved/Denied:   PASRR Number:    Discharge Plan: (ALF Memory Care)    Current Diagnoses: Patient Active Problem List   Diagnosis Date Noted  . Pressure injury of skin 10/21/2017  . Bronchitis 10/20/2017  . Acute hypoxemic respiratory failure (HCC) 09/08/2017  . Aspiration pneumonia (HCC)   . Palliative care by specialist   . Goals of care, counseling/discussion   . AKI (acute kidney injury) (HCC) 06/27/2017    Orientation RESPIRATION BLADDER Height & Weight     Self  O2 2 liters Incontinent Weight: 150 lb (68 kg) Height:  5' (152.4 cm)  BEHAVIORAL SYMPTOMS/MOOD NEUROLOGICAL BOWEL NUTRITION STATUS  (none ) (none) Incontinent    AMBULATORY STATUS COMMUNICATION OF NEEDS Skin   Not ambulatory   PU Stage and Appropriate Care                       Personal Care Assistance Level of Assistance  Bathing, Feeding, Dressing Maximum Assist Not ambulatory       Functional Limitations Info             SPECIAL CARE FACTORS FREQUENCY                       Contractures Contractures Info: Not present    Additional Factors Info   [facility]               Medication List    STOP taking these medications   albuterol (2.5 MG/3ML) 0.083% nebulizer solution Commonly known as:  PROVENTIL   amLODipine 10 MG tablet Commonly  known as:  NORVASC   Dimethicone 1.2 % Gel   DRAMAMINE 50 MG Chew Generic drug:  dimenhyDRINATE   losartan-hydrochlorothiazide 100-25 MG tablet Commonly known as:  HYZAAR   multivitamin with minerals Tabs tablet   potassium chloride 10 MEQ CR capsule Commonly known as:  MICRO-K   Vitamin B-12 1000 MCG Subl   Vitamin D 2000 units tablet     TAKE these medications   acetaminophen 500 MG tablet Commonly known as:  TYLENOL Take 500 mg by mouth every 4 (four) hours as needed for mild pain.   COMBIGAN 0.2-0.5 % ophthalmic solution Generic drug:  brimonidine-timolol Place 1 drop into the left eye 2 (two) times daily.   docusate sodium 100 MG capsule Commonly known as:  COLACE Take 1 capsule (100 mg total) by mouth 2 (two) times daily as needed for mild constipation.   ezetimibe 10 MG tablet Commonly known as:  ZETIA Take 10 mg by mouth daily.   feeding supplement (ENSURE ENLIVE) Liqd Take 237 mLs by mouth 2 (two) times daily between meals.   hydrocortisone 25 MG suppository Commonly known as:  ANUSOL-HC Place 25 mg rectally 2 (two) times daily as needed for hemorrhoids.   ipratropium-albuterol 0.5-2.5 (3) MG/3ML  Soln Commonly known as:  DUONEB Take 3 mLs by nebulization every 6 (six) hours as needed.   levothyroxine 125 MCG tablet Commonly known as:  SYNTHROID, LEVOTHROID Take 125 mcg by mouth daily before breakfast.   loratadine 10 MG tablet Commonly known as:  CLARITIN Take 10 mg by mouth daily.   magnesium oxide 400 MG tablet Commonly known as:  MAG-OX Take 400 mg by mouth daily.   meclizine 25 MG tablet Commonly known as:  ANTIVERT Take 25 mg by mouth every 8 (eight) hours as needed for dizziness.   mirtazapine 7.5 MG tablet Commonly known as:  REMERON Take 15 mg by mouth at bedtime.   polyethylene glycol packet Commonly known as:  MIRALAX / GLYCOLAX Take 17 g by mouth daily.   sennosides-docusate sodium 8.6-50 MG  tablet Commonly known as:  SENOKOT-S Take 2 tablets by mouth daily.   sertraline 50 MG tablet Commonly known as:  ZOLOFT Take 75 mg by mouth daily.       Relevant Imaging Results:  Relevant Lab Results:   Additional Information    York Spaniel, LCSW

## 2017-10-22 DIAGNOSIS — Z515 Encounter for palliative care: Secondary | ICD-10-CM

## 2017-10-22 DIAGNOSIS — Z7189 Other specified counseling: Secondary | ICD-10-CM

## 2017-10-22 DIAGNOSIS — J9601 Acute respiratory failure with hypoxia: Secondary | ICD-10-CM

## 2017-10-22 LAB — GLUCOSE, CAPILLARY
GLUCOSE-CAPILLARY: 81 mg/dL (ref 70–99)
Glucose-Capillary: 100 mg/dL — ABNORMAL HIGH (ref 70–99)

## 2017-10-22 MED ORDER — RACEPINEPHRINE HCL 2.25 % IN NEBU
0.5000 mL | INHALATION_SOLUTION | Freq: Once | RESPIRATORY_TRACT | Status: AC
  Administered 2017-10-22: 0.5 mL via RESPIRATORY_TRACT
  Filled 2017-10-22: qty 0.5

## 2017-10-22 MED ORDER — HEPARIN SODIUM (PORCINE) 5000 UNIT/ML IJ SOLN
5000.0000 [IU] | Freq: Three times a day (TID) | INTRAMUSCULAR | Status: DC
  Administered 2017-10-22 – 2017-10-23 (×2): 5000 [IU] via SUBCUTANEOUS
  Filled 2017-10-22 (×2): qty 1

## 2017-10-22 MED ORDER — INSULIN ASPART 100 UNIT/ML ~~LOC~~ SOLN: 0.0000 [IU] | Freq: Three times a day (TID) | SUBCUTANEOUS | Status: DC

## 2017-10-22 MED ORDER — OCUVITE-LUTEIN PO CAPS
1.0000 | ORAL_CAPSULE | Freq: Every day | ORAL | Status: DC
  Administered 2017-10-22 – 2017-10-23 (×2): 1 via ORAL
  Filled 2017-10-22 (×2): qty 1

## 2017-10-22 MED ORDER — INSULIN ASPART 100 UNIT/ML ~~LOC~~ SOLN: 0.0000 [IU] | Freq: Every day | SUBCUTANEOUS | Status: DC

## 2017-10-22 MED ORDER — NEPRO/CARBSTEADY PO LIQD
237.0000 mL | Freq: Two times a day (BID) | ORAL | Status: DC
  Administered 2017-10-22: 237 mL via ORAL

## 2017-10-22 NOTE — Progress Notes (Signed)
[employer] [facility] [place] [facility]   PATIENT NAME: Amanda Mercado    MR#:  [id number]  DATE OF BIRTH:  Aug 09, 1921  SUBJECTIVE:  CHIEF COMPLAINT:   Chief Complaint  Patient presents with  . Shortness of Breath  Patient is alert and has some wheezing today, chronic multiple pressure ulcerations on lower extremities/sacral/ischial areas  REVIEW OF SYSTEMS:   Because of dementia patient cannot give review of system.  ROS  DRUG ALLERGIES:   Allergies  Allergen Reactions  . Doxycycline Calcium Other (See Comments)  . Raloxifene Other (See Comments)  . Rofecoxib Other (See Comments)  . Statins Other (See Comments)    Entire body aches, bones and muscles  . Sulfa Antibiotics Other (See Comments)    VITALS:  Blood pressure (!) 142/69, pulse 80, temperature 98.1 F (36.7 C), temperature source Oral, resp. rate 20, height 5' (1.524 m), weight 68 kg, SpO2 93 %.  PHYSICAL EXAMINATION:   GENERAL:  84 y.o.-year-old patient lying in the bed with no acute distress.  EYES: Pupils equal, round, reactive to light and accommodation. No scleral icterus. Extraocular muscles intact.  HEENT: Head atraumatic, normocephalic. Oropharynx and nasopharynx clear.  NECK:  Supple, no jugular venous distention. No thyroid enlargement, no tenderness.  LUNGS: Normal breath sounds bilaterally, no wheezing, some bilateral crepitation. No use of accessory muscles of respiration.  CARDIOVASCULAR: S1, S2 normal. No murmurs, rubs, or gallops.  ABDOMEN: Soft, nontender, nondistended. Bowel sounds present. No organomegaly or mass.  Foley catheter in place. EXTREMITIES: Bilateral pedal edema, no cyanosis, or clubbing.  NEUROLOGIC: Cranial nerves II through XII are intact. Muscle strength 2-3/5 in all extremities. Sensation intact. Gait not checked.  PSYCHIATRIC: The patient is alert and oriented x 0.  SKIN: No obvious rash, lesion, or ulcer.  Physical Exam LABORATORY PANEL:   CBC Recent Labs   Lab 10/21/17 0546  WBC 8.3  HGB 8.2*  HCT 27.2*  PLT 304   ------------------------------------------------------------------------------------------------------------------  Chemistries  Recent Labs  Lab 10/21/17 0546  NA 135  K 4.3  CL 101  CO2 27  GLUCOSE 122*  BUN 20  CREATININE 0.68  CALCIUM 9.1   ------------------------------------------------------------------------------------------------------------------  Cardiac Enzymes Recent Labs  Lab 10/20/17 1347  TROPONINI <0.03   ------------------------------------------------------------------------------------------------------------------  RADIOLOGY:  No results found.  ASSESSMENT AND PLAN:  *Acute hypoxic respiratory failure Resolved Suspect due to acute bronchitis versus pneumonia Continue empiric IV cefepime, discontinue IV vancomycin-MRSA PCR negative, continue dysphagia level 3 diet-speech therapy reconsulted-suggested dysphagia level 2 diet this time.  *Acute probable complicated urinary tract infection Cannot exclude possible colonization Noted chronic indwelling Foley catheter for chronic urinary retention Follow-up on urine cultures, continue empiric IV cefepime Awaited sensitivity results.  *Acute toxic metabolic encephalopathy  Most likely secondary to above  Increase nursing care PRN, aspiration/fall/skin care precautions while in house  Now much improved and almost back to baseline.  *Hypertension Continue to hold hypertensive medications -currently normotensive  *Chronic hypothyroidism, unspecified Stable Continue levothyroxine  Long-term prognosis is dismal, family in agreement for continued palliative care/would want hospice if patient qualifies, no aggressive intervention, disposition pending clinical course After meeting with palliative care, family has agreed for hospice level of care at her facilities.  All the records are reviewed and case discussed with Care  Management/Social Workerr. Management plans discussed with the patient, family and they are in agreement.  CODE STATUS: DNR  TOTAL TIME TAKING CARE OF THIS PATIENT: 40 minutes.    POSSIBLE D/C IN 1-2 DAYS, DEPENDING  ON CLINICAL CONDITION.   Altamese Dilling M.D on 10/22/2017   Between 7am to 6pm - Pager - [phone]  After 6pm go to [email or web address] - password [email or web address]  [facility]  Office  [phone]  CC: Primary care physician; System, Pcp Not In  Note: This dictation was prepared with Dragon dictation along with smaller phrase technology. Any transcriptional errors that result from this process are unintentional.

## 2017-10-22 NOTE — Clinical Social Work Note (Signed)
CSW informed that patient is to have hospice at [facility]. [facility] had been speaking with patient's family prior to this admission. CSW will contact Kendal Hymen to ensure that this is an option.  York Spaniel MSW,LCSW

## 2017-10-22 NOTE — Progress Notes (Signed)
New referral for [facility] services following a Palliative Medicine consult. Patient is a 84 year old woman currently followed by outpatient Palliative NP at [facility], admitted to [facility] on 10/15 with acute respiratory failure and UTI. She has a PMH of dementia, COPD, DMII, GERD, HTN, hypothyroidism and arthritis. Patient has had 3 admission sin the last 3 months. Palliative medicine was consulted for goals of care and met with patient and her family today. They have chosen to return to [facility] with the support of Hospice services. °Writer met int he room with patient and her daughter Amanda Mercado and son Amanda Mercado, writer had met previously with Amanda Mercado at last admission when plan was to return to [facility] with hospice, but patient discharged to [facility] for STR. Hospice services were reviewed, questions answered. Hospice information and contact numbers given to Amanda Mercado. Patient was alert and somewhat interactive through pout the visit, she did report some pain, but was unable to explain/identify location. Staff Aide in during visit and patient was repositioned. Foley cathter remains in place d/t urinary retention noted at last hospital admission. °Patient information faxed to referral. CSW Monica Marra updated and aware of discharge plan. Will continue to follow through discharge. °Karen Robertson RN, BSN, CHPN °[facility], hospital Liaison °[phone] °

## 2017-10-22 NOTE — Consult Note (Signed)
Consultation Note Date: 10/22/2017   Patient Name: Amanda Mercado  DOB: 1921-11-22  MRN: [id number]  Age / Sex: 84 y.o., female  PCP: System, [place] Not In Referring Physician: Vaughan Mercado, *  Reason for Consultation: Establishing goals of care  HPI/Patient Profile: Amanda Mercado  is a 84 y.o. female with a known history of Arthritis, Urinary retention- chronic foley, DM, GERD, Htn, Hypothyroidism, who was sent to a nursing home a few weeks ago after treatment for aspiration pneumonia. She was sent to [facility] from there. At baseline, can not get up, uses lift to move to chair. Since yesterday patient has more cough and she is drowsy and have some wheezing.  The palliative care nurse at her [facility] suggested to take her to emergency room as she may have some infection.  Clinical Assessment and Goals of Care: Patient is resting in bed watching basketball. She states she lives basketball. 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 Beyond this she is confused. She cannot tell me how many children she has but initially names Amanda Mercado and Amanda Mercado. Then later names Amanda Mercado and Amanda Mercado and does not know who Amanda Mercado is when asked. She does not know whether she is widowed or divorced. She does not know the year or president, and when asked where she lives she said "here".  Called her daughter Amanda Mercado. Amanda Mercado has 1 brother Amanda Mercado. Amanda Mercado was widowed at 84 years old. 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 Amanda Mercado states her mother's quality of life is poor and she forsees a circle of hospitalization and discharge to facility.    We discussed her diagnosis, prognosis, GOC, EOL wishes disposition and options. Natural trajectory and expectations at EOL were discussed.  She states her mother has been in a facility since February. She spends time in bed and requires a lift to get her to her wheelchair. She has  always been an independent woman and this is not  quality of life for her. She has been eating and drinking well at the facility.   Spoke with brother Amanda Mercado. He states he would want his mother transported to the hospital if there was an issue like pneumonia that could be treated.   Spoke with Amanda Mercado and Amanda Mercado together at bedside. Discussed that her dementia is progressive as well as swallow dysfunction. They discuss her recent admission for aspiration pneumonia. They agree they would like hospice to follow at the facility.    Amanda Mercado and [place] share medical decision making.    I completed a MOST form today. The family outlined their wishes for the following treatment decisions:  Cardiopulmonary Resuscitation: Do Not Attempt Resuscitation (DNR/No CPR)  Medical Interventions: Comfort Measures: Keep clean, warm, and dry. Use medication by any route, positioning, wound care, and other measures to relieve pain and suffering. Use oxygen, suction and manual treatment of airway obstruction as needed for comfort. Do not transfer to the hospital unless comfort needs cannot be met in current location.  Antibiotics: No antibiotics (use other  measures to relieve symptoms)  IV Fluids: No IV fluids (provide other measures to ensure comfort)  Feeding Tube: No feeding tube      SUMMARY OF RECOMMENDATIONS   Recommend D/C back to facility with hospice.    Code Status/Advance Care Planning:  DNR    Symptom Management:   No current complaints.  Prognosis:   < 6 months Recent ED visits and admissions for with respiratory distress and cardiac abnormalities, UTI, dehydration. Dysphagia. Recent diagnosis of aspiration PNA.    Discharge Planning: [place] with Hospice      Primary Diagnoses: Present on Admission: . Acute hypoxemic respiratory failure ([place]) . Bronchitis   I have reviewed the medical record, interviewed the patient and family, and examined the patient. The following  aspects are pertinent.  Past Medical History:  Diagnosis Date  . Arthritis    ra  . COPD (chronic obstructive pulmonary disease) ([place])   . Diabetes mellitus without complication ([place])   . Dizziness    occ  . GERD (gastroesophageal reflux disease)   . HOH (hard of hearing)   . Hypertension   . Hypothyroidism   . Psoriasis   . Rosacea   . Shortness of breath dyspnea    Social History   Socioeconomic History  . Marital status: Widowed    Spouse name: Not on file  . Number of children: Not on file  . Years of education: Not on file  . Highest education level: Not on file  Occupational History  . Not on file  Social Needs  . Financial resource strain: Not on file  . Food insecurity:    Worry: Not on file    Inability: Not on file  . Transportation needs:    Medical: Not on file    Non-medical: Not on file  Tobacco Use  . Smoking status: Former Research scientist (life sciences)  . Smokeless tobacco: Never Used  Substance and Sexual Activity  . Alcohol use: No  . Drug use: Not on file  . Sexual activity: Not on file  Lifestyle  . Physical activity:    Days per week: Not on file    Minutes per session: Not on file  . Stress: Not on file  Relationships  . Social connections:    Talks on phone: Not on file    Gets together: Not on file    Attends religious service: Not on file    Active member of club or organization: Not on file    Attends meetings of clubs or organizations: Not on file    Relationship status: Not on file  Other Topics Concern  . Not on file  Social History Narrative  . Not on file   History reviewed. No pertinent family history. Scheduled Meds: . brimonidine  1 drop Left Eye BID   And  . timolol  1 drop Left Eye BID  . cholecalciferol  4,000 Units Oral Daily  . ezetimibe  10 mg Oral Daily  . feeding supplement (NEPRO CARB STEADY)  237 mL Oral BID BM  . heparin  5,000 Units Subcutaneous Q8H  . levothyroxine  125 mcg Oral QAC breakfast  . loratadine  10 mg Oral  Daily  . magnesium oxide  400 mg Oral Daily  . mirtazapine  15 mg Oral QHS  . multivitamin with minerals  1 tablet Oral Daily  . multivitamin-lutein  1 capsule Oral Daily  . polyethylene glycol  17 g Oral Daily  . senna-docusate  2 tablet Oral Daily  .  sertraline  75 mg Oral Daily  . vitamin B-12  1,000 mcg Oral Daily   Continuous Infusions: . ceFEPime (MAXIPIME) IV Stopped (10/22/17 0742)   PRN Meds:.acetaminophen, docusate sodium, hydrocortisone, ipratropium-albuterol, meclizine Medications Prior to Admission:  Prior to Admission medications   Medication Sig Start Date End Date Taking? Authorizing Provider  acetaminophen (TYLENOL) 500 MG tablet Take 500 mg by mouth every 4 (four) hours as needed for mild pain.    Yes [provider]  albuterol (PROVENTIL) (2.5 MG/3ML) 0.083% nebulizer solution Take 2.5 mg by nebulization every 6 (six) hours as needed for wheezing or shortness of breath.   Yes [provider]  amLODipine (NORVASC) 10 MG tablet Take 1 tablet (10 mg total) by mouth daily. 09/11/17  Yes Amanda Basta, MD  brimonidine-timolol (COMBIGAN) 0.2-0.5 % ophthalmic solution Place 1 drop into the left eye 2 (two) times daily.    Yes [provider]  Cholecalciferol (VITAMIN D) 2000 units tablet Take 4,000 Units by mouth daily.   Yes [provider]  Cyanocobalamin (VITAMIN B-12) 1000 MCG SUBL Place 1,000 mcg under the tongue daily.   Yes [provider]  dimenhyDRINATE (DRAMAMINE) 50 MG CHEW Chew 1 tablet by mouth every 4 (four) hours as needed (for dizziness).    Yes [provider]  Dimethicone (MONISTAT COMPLETE CARE) 1.2 % GEL Apply topically to the underside of the breasts and groin area as needed for yeast 10/06/17  Yes McGowan, Larene Beach A, PA-C  ezetimibe (ZETIA) 10 MG tablet Take 10 mg by mouth daily.   Yes [provider]  feeding supplement, ENSURE ENLIVE, (ENSURE ENLIVE) LIQD Take 237 mLs by mouth 2 (two)  times daily between meals. 09/10/17  Yes Amanda Basta, MD  hydrocortisone (ANUSOL-HC) 25 MG suppository Place 25 mg rectally 2 (two) times daily as needed for hemorrhoids.    Yes [provider]  levothyroxine (SYNTHROID, LEVOTHROID) 125 MCG tablet Take 125 mcg by mouth daily before breakfast.   Yes [provider]  loratadine (CLARITIN) 10 MG tablet Take 10 mg by mouth daily.   Yes [provider]  losartan-hydrochlorothiazide (HYZAAR) 100-25 MG tablet Take 1 tablet by mouth daily.   Yes [provider]  magnesium oxide (MAG-OX) 400 MG tablet Take 400 mg by mouth daily.   Yes [provider]  meclizine (ANTIVERT) 25 MG tablet Take 25 mg by mouth every 8 (eight) hours as needed for dizziness.    Yes [provider]  mirtazapine (REMERON) 7.5 MG tablet Take 15 mg by mouth at bedtime.    Yes [provider]  Multiple Vitamin (MULTIVITAMIN WITH MINERALS) TABS tablet Take 1 tablet by mouth daily. 09/11/17  Yes Amanda Basta, MD  polyethylene glycol [facility]) packet Take 17 g by mouth daily. 10/06/17  Yes McGowan, Larene Beach A, PA-C  potassium chloride (MICRO-K) 10 MEQ CR capsule Take 10 mEq by mouth daily.   Yes [provider]  sennosides-docusate sodium (SENOKOT-S) 8.6-50 MG tablet Take 2 tablets by mouth daily.    Yes [provider]  sertraline (ZOLOFT) 50 MG tablet Take 75 mg by mouth daily.    Yes [provider]   Allergies  Allergen Reactions  . Doxycycline Calcium Other (See Comments)  . Raloxifene Other (See Comments)  . Rofecoxib Other (See Comments)  . Statins Other (See Comments)    Entire body aches, bones and muscles  . Sulfa Antibiotics Other (See Comments)   Review of Systems  Respiratory: Positive for wheezing.  Physical Exam  Constitutional: No distress.  Pulmonary/Chest: Effort normal.  Audible wheezing  Neurological: She is alert.  Skin: Skin is warm and dry.     Vital Signs: BP 129/72 (BP Location: Right Arm)   Pulse 73   Temp 98.3 F (36.8 C)   Resp 18   Ht 5' (1.524 m)   Wt 68 kg   SpO2 92%   BMI 29.29 kg/m  Pain Scale: PAINAD   Pain Score: Asleep   SpO2: SpO2: 92 % O2 Device:SpO2: 92 % O2 Flow Rate: .   IO: Intake/output summary:   Intake/Output Summary (Last 24 hours) at 10/22/2017 1344 Last data filed at 10/22/2017 1000 Gross per 24 hour  Intake 475.23 ml  Output 1475 ml  Net -999.77 ml    LBM: Last BM Date: 10/22/17 Baseline Weight: Weight: 68 kg Most recent weight: Weight: 68 kg     Palliative Assessment/Data: 30%     Time In: 2:00 Time Out: 4:30 Time Total: 150 min Greater than 50%  of this time was spent counseling and coordinating care related to the above assessment and plan.  Signed by: Asencion Gowda, NP   Please contact Palliative Medicine Team phone at [phone] for questions and concerns.  For individual provider: See Shea Evans

## 2017-10-23 LAB — GLUCOSE, CAPILLARY
GLUCOSE-CAPILLARY: 75 mg/dL (ref 70–99)
GLUCOSE-CAPILLARY: 82 mg/dL (ref 70–99)
GLUCOSE-CAPILLARY: 93 mg/dL (ref 70–99)

## 2017-10-23 MED ORDER — DOCUSATE SODIUM 100 MG PO CAPS: 100.0000 mg | ORAL_CAPSULE | Freq: Two times a day (BID) | ORAL | 0 refills | Status: AC | PRN

## 2017-10-23 MED ORDER — IPRATROPIUM-ALBUTEROL 0.5-2.5 (3) MG/3ML IN SOLN: 3.0000 mL | Freq: Four times a day (QID) | RESPIRATORY_TRACT | 0 refills | Status: AC | PRN

## 2017-10-23 NOTE — Care Management Important Message (Signed)
Copy of signed IM left with patient in room.  

## 2017-10-23 NOTE — Discharge Instructions (Signed)
Hospice care at facility

## 2017-10-23 NOTE — Clinical Social Work Note (Signed)
Patient will require oxygen at [facility]. CSW contacted Steward Drone at [facility] and informed her that patient will discharge to [facility] with hopsice and Steward Drone stated that this was fine. York Spaniel MSW,LCSW [phone]

## 2017-10-23 NOTE — Discharge Summary (Signed)
[facility] [place] [facility]   PATIENT NAME: Amanda Mercado    MR#:  [id number]  DATE OF BIRTH:  May 28, 1921  DATE OF ADMISSION:  10/20/2017 ADMITTING PHYSICIAN: Altamese Dilling, MD  DATE OF DISCHARGE: 10/23/2017   PRIMARY CARE PHYSICIAN: System, Pcp Not In    ADMISSION DIAGNOSIS:  Encephalopathy acute [G93.40] HCAP (healthcare-associated pneumonia) [J18.9] Acute on chronic anemia [D64.9]  DISCHARGE DIAGNOSIS:  Active Problems:   Acute hypoxemic respiratory failure (HCC)   Bronchitis   Pressure injury of skin   SECONDARY DIAGNOSIS:   Past Medical History:  Diagnosis Date  . Arthritis    ra  . COPD (chronic obstructive pulmonary disease) (HCC)   . Diabetes mellitus without complication (HCC)   . Dizziness    occ  . GERD (gastroesophageal reflux disease)   . HOH (hard of hearing)   . Hypertension   . Hypothyroidism   . Psoriasis   . Rosacea   . Shortness of breath dyspnea     HOSPITAL COURSE:   *Acute hypoxic respiratory failure Resolved Suspect due to acute bronchitis versus pneumonia Continue empiric IV cefepime, discontinue IV vancomycin-MRSA PCR negative, continue dysphagia level 3 diet-speech therapy reconsulted-suggested dysphagia level 2 diet this time. Will need oxygen with hospice level of care.  *Acute probable complicated urinary tract infection Cannot exclude possible colonization Noted chronic indwelling Foley catheter for chronic urinary retention Follow-up on urine cultures, continue empiric IV cefepime Awaited sensitivity results.  now hospice, no antibiotics.  *Acute toxic metabolic encephalopathy  Most likely secondary to above  Increase nursing care PRN, aspiration/fall/skin care precautions while in house  Now much improved and almost back to baseline.  *Hypertension Continue to hold hypertensive medications -currently normotensive  *Chronic hypothyroidism, unspecified Stable Continue  levothyroxine  Long-term prognosis is dismal, family in agreement for continued palliative care/would want hospice if patient qualifies, no aggressive intervention, disposition pending clinical course After meeting with palliative care, family has agreed for hospice level of care at her facilities.  DISCHARGE CONDITIONS:   Stable.  CONSULTS OBTAINED:    DRUG ALLERGIES:   Allergies  Allergen Reactions  . Doxycycline Calcium Other (See Comments)  . Raloxifene Other (See Comments)  . Rofecoxib Other (See Comments)  . Statins Other (See Comments)    Entire body aches, bones and muscles  . Sulfa Antibiotics Other (See Comments)    DISCHARGE MEDICATIONS:   Allergies as of 10/23/2017      Reactions   Doxycycline Calcium Other (See Comments)   Raloxifene Other (See Comments)   Rofecoxib Other (See Comments)   Statins Other (See Comments)   Entire body aches, bones and muscles   Sulfa Antibiotics Other (See Comments)      Medication List    STOP taking these medications   albuterol (2.5 MG/3ML) 0.083% nebulizer solution Commonly known as:  PROVENTIL   amLODipine 10 MG tablet Commonly known as:  NORVASC   Dimethicone 1.2 % Gel   DRAMAMINE 50 MG Chew Generic drug:  dimenhyDRINATE   losartan-hydrochlorothiazide 100-25 MG tablet Commonly known as:  HYZAAR   multivitamin with minerals Tabs tablet   potassium chloride 10 MEQ CR capsule Commonly known as:  MICRO-K   Vitamin B-12 1000 MCG Subl   Vitamin D 2000 units tablet     TAKE these medications   acetaminophen 500 MG tablet Commonly known as:  TYLENOL Take 500 mg by mouth every 4 (four) hours as needed for mild pain.   COMBIGAN 0.2-0.5 % ophthalmic  solution Generic drug:  brimonidine-timolol Place 1 drop into the left eye 2 (two) times daily.   docusate sodium 100 MG capsule Commonly known as:  COLACE Take 1 capsule (100 mg total) by mouth 2 (two) times daily as needed for mild constipation.    ezetimibe 10 MG tablet Commonly known as:  ZETIA Take 10 mg by mouth daily.   feeding supplement (ENSURE ENLIVE) Liqd Take 237 mLs by mouth 2 (two) times daily between meals.   hydrocortisone 25 MG suppository Commonly known as:  ANUSOL-HC Place 25 mg rectally 2 (two) times daily as needed for hemorrhoids.   ipratropium-albuterol 0.5-2.5 (3) MG/3ML Soln Commonly known as:  DUONEB Take 3 mLs by nebulization every 6 (six) hours as needed.   levothyroxine 125 MCG tablet Commonly known as:  SYNTHROID, LEVOTHROID Take 125 mcg by mouth daily before breakfast.   loratadine 10 MG tablet Commonly known as:  CLARITIN Take 10 mg by mouth daily.   magnesium oxide 400 MG tablet Commonly known as:  MAG-OX Take 400 mg by mouth daily.   meclizine 25 MG tablet Commonly known as:  ANTIVERT Take 25 mg by mouth every 8 (eight) hours as needed for dizziness.   mirtazapine 7.5 MG tablet Commonly known as:  REMERON Take 15 mg by mouth at bedtime.   polyethylene glycol packet Commonly known as:  MIRALAX / GLYCOLAX Take 17 g by mouth daily.   sennosides-docusate sodium 8.6-50 MG tablet Commonly known as:  SENOKOT-S Take 2 tablets by mouth daily.   sertraline 50 MG tablet Commonly known as:  ZOLOFT Take 75 mg by mouth daily.        DISCHARGE INSTRUCTIONS:    Follow for comfort with hospice care.  If you experience worsening of your admission symptoms, develop shortness of breath, life threatening emergency, suicidal or homicidal thoughts you must seek medical attention immediately by calling 911 or calling your MD immediately  if symptoms less severe.  You Must read complete instructions/literature along with all the possible adverse reactions/side effects for all the Medicines you take and that have been prescribed to you. Take any new Medicines after you have completely understood and accept all the possible adverse reactions/side effects.   Please note  You were cared for by  a hospitalist during your hospital stay. If you have any questions about your discharge medications or the care you received while you were in the hospital after you are discharged, you can call the unit and asked to speak with the hospitalist on call if the hospitalist that took care of you is not available. Once you are discharged, your primary care physician will handle any further medical issues. Please note that NO REFILLS for any discharge medications will be authorized once you are discharged, as it is imperative that you return to your primary care physician (or establish a relationship with a primary care physician if you do not have one) for your aftercare needs so that they can reassess your need for medications and monitor your lab values.    Today   CHIEF COMPLAINT:   Chief Complaint  Patient presents with  . Shortness of Breath    HISTORY OF PRESENT ILLNESS:  Amanda Mercado  is a 84 y.o. female with a known history of Arthritis, Urinary retention- chronic foley, DM, GERD, Htn, Hypothyroidism, was sent to nursing home few weeks ago after treatment for aspiration pneumonia. [facility]. At baseline, can not get up, uses lift to move  to chair. Since yesterday patient has more cough and she is drowsy and have some wheezing.  The palliative care nurse at her [facility] suggested to take her to emergency room as she may have some infection. She was noted to be hypoxic and blood pressure was borderline. CT scan on the chest reported changes suggestive of bronchitis and some infiltrate on the lungs so she was given to admit to hospitalist team. Her UA is positive but she has chronic indwelling Foley catheter since last admission.    VITAL SIGNS:  Blood pressure (!) 163/82, pulse 72, temperature (!) 97.5 F (36.4 C), temperature source Oral, resp. rate 20, height 5' (1.524 m), weight 68 kg, SpO2 94 %.  I/O:    Intake/Output Summary (Last 24 hours) at  10/23/2017 1124 Last data filed at 10/23/2017 0512 Gross per 24 hour  Intake 120 ml  Output 1800 ml  Net -1680 ml    PHYSICAL EXAMINATION:   GENERAL:84 y.o.-year-old patient lying in the bed with no acute distress.  EYES: Pupils equal, round, reactive to light and accommodation. No scleral icterus. Extraocular muscles intact.  HEENT: Head atraumatic, normocephalic. Oropharynx and nasopharynx clear.  NECK: Supple, no jugular venous distention. No thyroid enlargement, no tenderness.  LUNGS: Normal breath sounds bilaterally, no wheezing,some bilateralcrepitation. No use of accessory muscles of respiration.  CARDIOVASCULAR: S1, S2 normal. No murmurs, rubs, or gallops.  ABDOMEN: Soft, nontender, nondistended. Bowel sounds present. No organomegaly or mass.Foley catheter in place. EXTREMITIES:Bilateralpedal edema,nocyanosis, or clubbing.  NEUROLOGIC: Cranial nerves II through XII are intact. Muscle strength2 /5 in all extremities. Sensation intact. Gait not checked.  PSYCHIATRIC: The patient is alert and oriented x0.  SKIN: No obvious rash, lesion, or ulcer.   DATA REVIEW:   CBC Recent Labs  Lab 10/21/17 0546  WBC 8.3  HGB 8.2*  HCT 27.2*  PLT 304    Chemistries  Recent Labs  Lab 10/21/17 0546  NA 135  K 4.3  CL 101  CO2 27  GLUCOSE 122*  BUN 20  CREATININE 0.68  CALCIUM 9.1    Cardiac Enzymes Recent Labs  Lab 10/20/17 1347  TROPONINI <0.03    Microbiology Results  Results for orders placed or performed during the hospital encounter of 10/20/17  MRSA PCR Screening     Status: None   Collection Time: 10/20/17  1:10 PM  Result Value Ref Range Status   MRSA by PCR NEGATIVE NEGATIVE Final    Comment:        The GeneXpert MRSA Assay (FDA approved for NASAL specimens only), is one component of a comprehensive MRSA colonization surveillance program. It is not intended to diagnose MRSA infection nor to guide or monitor treatment for MRSA  infections. Performed at [facility], [address], [place], [place] [address]   Urine Culture     Status: Abnormal (Preliminary result)   Collection Time: 10/20/17  1:47 PM  Result Value Ref Range Status   Specimen Description   Final    URINE, RANDOM Performed at [facility], [address], [place], [place] [address]    Special Requests   Final    NONE Performed at [facility], [address] [place], [place] [address]    Culture >=100,000 COLONIES/mL GRAM NEGATIVE RODS (A)  Final   Report Status PENDING  Incomplete    RADIOLOGY:  No results found.  EKG:   Orders placed or performed during the hospital encounter of 10/20/17  . EKG 12-Lead  .  EKG 12-Lead      Management plans discussed with the patient, family and they are in agreement.  CODE STATUS: DNR    Code Status Orders  (From admission, onward)         Start     Ordered   10/20/17 1937  Do not attempt resuscitation (DNR)  Continuous    Question Answer Comment  In the event of cardiac or respiratory ARREST Do not call a "code blue"   In the event of cardiac or respiratory ARREST Do not perform Intubation, CPR, defibrillation or ACLS   In the event of cardiac or respiratory ARREST Use medication by any route, position, wound care, and other measures to relive pain and suffering. May use oxygen, suction and manual treatment of airway obstruction as needed for comfort.      10/20/17 1936        Code Status History    Date Active Date Inactive Code Status Order ID Comments User Context   09/08/2017 0847 09/11/2017 2145 DNR [id number]  Barbaraann Rondo, MD Inpatient   09/08/2017 0302 09/08/2017 0847 DNR [id number]  Loleta Rose, MD ED   06/27/2017 1759 06/28/2017 1634 DNR [id number]  Milagros Loll, MD ED    Advance Directive Documentation     Most Recent Value  Type of Advance Directive  Out of facility DNR (pink MOST or yellow form), Healthcare Power of Attorney  Pre-existing  out of facility DNR order (yellow form or pink MOST form)  Yellow form placed in chart (order not valid for inpatient use)  "MOST" Form in Place?  -      TOTAL TIME TAKING CARE OF THIS PATIENT: 35 minutes.    Altamese Dilling M.D on 10/23/2017 at 11:24 AM  Between 7am to 6pm - Pager - [phone]  After 6pm go to [email or web address] - password [email or web address]  [facility] [place] [facility]  Office  [phone]  CC: Primary care physician; System, Pcp Not In   Note: This dictation was prepared with Dragon dictation along with smaller phrase technology. Any transcriptional errors that result from this process are unintentional.

## 2017-10-23 NOTE — Progress Notes (Addendum)
Follow up visit made to new referral for hospice of [facility] services at [facility]. Patient seen lying in bed, now requiring oxygen. Son at bedside. Oxygen and nebulizer machine ordered through triage this morning. [facility] updated. Patient will require EMS transport with signed DNR in place. Discharge summary faxed to referral. Will continue to follow through discharge. Dayna Barker RN, BSN, [facility] [place], hospital Liaison [phone] Notified by staff RN Lauren that [facility] had expected a hospital bed to be delivered with the oxygen. Hospital bed ordered and delivered. Lauren made aware.

## 2017-10-23 NOTE — Progress Notes (Addendum)
Pt prepared for d/c to [facility]. IV d/c'd prior. Skin intact except as charted in most recent assessments, wound dressing clean and intact. Next wound dressing change is due tomorrow, RN told receiving nurse at facility. Vitals are stable. Report called to receiving facility. Pt to be transported by ambulance service once they arrive. Verbal order to discharge pt with foley for chronic use. RN updated oncoming RN about discharge and pt inline for transport.   Khrystian Schauf [employer]

## 2017-10-24 LAB — URINE CULTURE: Culture: >=100000 — AB

## 2017-12-08 ENCOUNTER — Encounter: Payer: Self-pay | Admitting: Nurse Practitioner

## 2017-12-08 ENCOUNTER — Non-Acute Institutional Stay: Payer: Medicare Other | Admitting: Nurse Practitioner

## 2017-12-08 VITALS — HR 76 | Temp 98.0°F | Resp 18 | Wt 140.0 lb

## 2017-12-08 DIAGNOSIS — R54 Age-related physical debility: Secondary | ICD-10-CM

## 2017-12-08 DIAGNOSIS — R0600 Dyspnea, unspecified: Secondary | ICD-10-CM

## 2017-12-08 DIAGNOSIS — Z515 Encounter for palliative care: Secondary | ICD-10-CM

## 2017-12-08 DIAGNOSIS — R413 Other amnesia: Secondary | ICD-10-CM

## 2017-12-08 NOTE — Progress Notes (Signed)
[facility] Telephone: [phone] Fax: [phone]  PATIENT NAME: Amanda Mercado DOB: 11-09-1921 MRN: [id number]  PRIMARY CARE PROVIDER:   [facility] REFERRING PROVIDER:  [facility] RESPONSIBLE PARTY:  Daughter [name] Health Care power-of-attorney [phone]  ASSESSMENT: I visited and observed Amanda Mercado. Talked about purpose for palliative medicine visit though with cognitive impairment had difficulty processing. I asked if she was having symptoms of pain and she replied no. I asked if she was hungry and she replied no. She did verbalize she was having a good day. She talked about the Christmas tree in the room. She was limited with her verbal exchange due to cognitive impairment. She was cooperative with assessment. Emotional support provided. Amanda Mercado does continue to appear stable at present time, updated nursing staff. I called Amanda Mercado, her daughter Health Care power of attorney with message left to return call about update on palliative medicine visit. Will follow up in two months if needed or sooner should she declined.  10 / 16 / 2019 sodium 135, potassium 4.3, chloride 101, Co2 27, calcium 9.1, bun 20, creatinine 0.68, glucose 122, WBC 8.3, hemoglobin 8.2, hematocrit 27.2, platelets 304  10 / 16 / 2019 weight 143.0 lb 12/08/2017 weight 140.0 lb    RECOMMENDATIONS and PLAN:  1. Memory loss R41.3 secondary to dementia. Continue with supportive measures as chronic disease remains Progressive.  2. Physical debility R54 secondary to dementia, Progressive continue to encourage Mobility as she currently is a lift to the wheelchair. Encourage staff to continue to get her up daily as able as she currently resides Assisted Living. Restorative exercises as able  3. Dyspnea R06.00 secondary to COPD, currently stable at present time. Not O2 dependent. Continue inhalation therapy as needed.  4. Palliative Care Encounter Z51.5 continue to  follow for symptom and chronic disease management, continue to try to contact daughter, Amanda Mercado. No new changes to current goc, remains focus on comfort with DNR in place. Follow-up 2 months if needed or sooner if declines  I spent 60 minutes providing this consultation,  from 10:15am  To 11:15am. More than 50% of the time in this consultation was spent coordinating communication.   HISTORY OF PRESENT ILLNESS:  Amanda Mercado 84 year old female with multiple medical problems including dementia, COPD, diabetes, ger, hypertension, hypothyroidism, psoriasis, arthritis, hard of hearing, bilateral cataract surgery, back surgery, anterior vitrectomy left, abdominal hysterectomy. She resides in locked memory care unit at [facility] secondary to dementia. She requires assistance for transfers, adl's with incontinence. She is able to feed herself after tray setup. Staff endorses no new changes to goals are plan of care, no recent wounds, falls.9 / 6 / 2019 hospitalized for aspiration pneumonia improve with antibiotic therapy. Hypokalemia improve with replacement. Sinus tachycardia and proved with fluids. Hematuria held DVT prophylaxis. 10/20/2017 last palliative care visit for symptom management of decrease appetite, dyspnea. She was sent to the emergency department due to respiratory distress at this visit. She was hospitalized 10 / 15 / 2019 to 10 / 18 / 2019 for acute encephalopathy likely due to hypoxic respiratory failure complicated by urinary tract infection. She did return to assisted living facility [facility] locked memory unit with hospice screening. When hospice evaluated found that she was not eligible at that time and then continued palliative care. At present she is sitting in the wheelchair in the common area. She appears debilitated but comfortable. No visitors present. Palliative Care was asked to help address  goals of care.   CODE STATUS: DNR  PPS: 30% HOSPICE ELIGIBILITY/DIAGNOSIS: No  PAST MEDICAL  HISTORY:  Past Medical History:  Diagnosis Date  . Age-related physical debility   . Arthritis    ra  . COPD (chronic obstructive pulmonary disease) (HCC)   . Diabetes mellitus without complication (HCC)   . Dizziness    occ  . Dyspnea   . GERD (gastroesophageal reflux disease)   . HOH (hard of hearing)   . Hypertension   . Hypothyroidism   . Memory loss   . Psoriasis   . Rosacea   . Shortness of breath dyspnea     SOCIAL HX:  Social History   Tobacco Use  . Smoking status: Former Games developer  . Smokeless tobacco: Never Used  Substance Use Topics  . Alcohol use: No    ALLERGIES:  Allergies  Allergen Reactions  . Doxycycline Calcium Other (See Comments)  . Raloxifene Other (See Comments)  . Rofecoxib Other (See Comments)  . Statins Other (See Comments)    Entire body aches, bones and muscles  . Sulfa Antibiotics Other (See Comments)     PERTINENT MEDICATIONS:  Outpatient Encounter Medications as of 12/08/2017  Medication Sig  . acetaminophen (TYLENOL) 500 MG tablet Take 500 mg by mouth every 4 (four) hours as needed for mild pain.   . brimonidine-timolol (COMBIGAN) 0.2-0.5 % ophthalmic solution Place 1 drop into the left eye 2 (two) times daily.   Marland Kitchen docusate sodium (COLACE) 100 MG capsule Take 1 capsule (100 mg total) by mouth 2 (two) times daily as needed for mild constipation.  Marland Kitchen ezetimibe (ZETIA) 10 MG tablet Take 10 mg by mouth daily.  . feeding supplement, ENSURE ENLIVE, (ENSURE ENLIVE) LIQD Take 237 mLs by mouth 2 (two) times daily between meals.  . hydrocortisone (ANUSOL-HC) 25 MG suppository Place 25 mg rectally 2 (two) times daily as needed for hemorrhoids.   Marland Kitchen ipratropium-albuterol (DUONEB) 0.5-2.5 (3) MG/3ML SOLN Take 3 mLs by nebulization every 6 (six) hours as needed.  Marland Kitchen levothyroxine (SYNTHROID, LEVOTHROID) 125 MCG tablet Take 125 mcg by mouth daily before breakfast.  . loratadine (CLARITIN) 10 MG tablet Take 10 mg by mouth daily.  . magnesium oxide (MAG-OX)  400 MG tablet Take 400 mg by mouth daily.  . meclizine (ANTIVERT) 25 MG tablet Take 25 mg by mouth every 8 (eight) hours as needed for dizziness.   . mirtazapine (REMERON) 7.5 MG tablet Take 15 mg by mouth at bedtime.   . polyethylene glycol (MIRALAX) packet Take 17 g by mouth daily.  . sennosides-docusate sodium (SENOKOT-S) 8.6-50 MG tablet Take 2 tablets by mouth daily.   . sertraline (ZOLOFT) 50 MG tablet Take 75 mg by mouth daily.    No facility-administered encounter medications on file as of 12/08/2017.     PHYSICAL EXAM:   General: NAD, frail appearing, obese, debilitated, confused female Cardiovascular: regular rate and rhythm Pulmonary: clear ant fields Abdomen: soft, nontender, + bowel sounds GU: no suprapubic tenderness Extremities: Mild edema, no joint deformities Skin: no rashes Neurological: Weakness but otherwise nonfocal   Prince Rome, NP

## 2017-12-09 ENCOUNTER — Encounter: Payer: Self-pay | Admitting: Nurse Practitioner

## 2017-12-09 ENCOUNTER — Telehealth: Payer: Self-pay | Admitting: Nurse Practitioner

## 2017-12-09 NOTE — Telephone Encounter (Signed)
Amanda Dandy, [name], Health Care power-of-attorney return call. We talked about purpose from palliative medicine visit. We talked about visit with [name]. We talked about clinical update and overall progression of Dimensions ending of chronic disease, natural aging. We talked about recent hospitalization. We talked about symptoms and at present time appetite continues to remain poor the consistent. Weight has been stable. We talked about at present time [name] does continue to appear stable. We talked about role of palliative medicine and plan of care. Medical goals with focus on Comfort. We talked about hospice as at present time she is stable and would not meet criteria. Discuss that will follow up in 3 months if needed or sooner should she declined. Mary an agreement. Therapeutic listening and emotional support provided. Contact information provided. Questions answered to satisfaction.  Total time spent 35 minutes  Documentation 10 minutes  Phone discussion 25 minutes

## 2017-12-22 NOTE — Telephone Encounter (Signed)
This encounter was created in error - please disregard.

## 2018-02-02 ENCOUNTER — Encounter: Payer: Self-pay | Admitting: Nurse Practitioner

## 2018-02-02 ENCOUNTER — Non-Acute Institutional Stay: Payer: Medicare Other | Admitting: Nurse Practitioner

## 2018-02-02 VITALS — HR 78 | Resp 18 | Wt 138.8 lb

## 2018-02-02 DIAGNOSIS — Z515 Encounter for palliative care: Secondary | ICD-10-CM

## 2018-02-02 DIAGNOSIS — R54 Age-related physical debility: Secondary | ICD-10-CM

## 2018-02-02 DIAGNOSIS — R0602 Shortness of breath: Secondary | ICD-10-CM | POA: Insufficient documentation

## 2018-02-02 DIAGNOSIS — R413 Other amnesia: Secondary | ICD-10-CM | POA: Insufficient documentation

## 2018-02-02 NOTE — Progress Notes (Signed)
[facility] Telephone: [phone] Fax: [phone]  PATIENT NAME: Amanda AcheLillian D Bergum DOB: April 20, 1921 MRN: [id number]  PRIMARY CARE PROVIDER:   [facility] REFERRING PROVIDER:  [facility] RESPONSIBLE PARTY:   Daughter Devoria [facility] power-of-attorney [phone]  ASSESSMENT:     I visited and observed Ms Yetta BarreJones. We talked about purpose for palliative care visit. We talked how her day was and she endorses she's doing fine. We talked about her audible wheezing which she is just completed a treatment. She shared that she is we used all her life and that her breathing is at baseline. We talked about residing at the facility. We talked about activities which Ms Yetta BarreJones endorses she enjoys. [facility] and [facility] She feels like it is  more at home and she really appreciates the staff. Medical goals have been to focus on treat what is treatable with DNR are in place. Ms Yetta BarreJones endorses that she's doing fine and has no complaints or concerns. We talked about her daughter Corrie DandyMary. Therapeutic listening and emotional support provided. I [facility] to [facility], updated staff  10 / 16 / 2019 weight 143.0 lb 12/08/2017 weight 140.0 lb 01/06/2018 weight 138.8 lb  RECOMMENDATIONS and PLAN:  1. Palliative Care Encounter Z51.5 continue to follow for symptom and chronic disease management, continue to try to contact daughter, Corrie DandyMary. No new changes to current goc, remains focus on comfort with DNR in place. Follow-up 2 months if needed or sooner if declines  2. Memory loss R41.3 secondary to dementia. Continue with supportive measures as chronic disease remains Progressive.  3. Physical debility R54 secondary to dementia, Progressive continue to encourage Mobility as she currently is a lift to the wheelchair. Encourage staff to continue to get her up daily as able as she currently resides Assisted Living. Restorative  exercises as able  4. Dyspnea R06.00 secondary to COPD, currently stable at present time. Not O2 dependent. Continue inhalation therapy as needed.  I spent 60 minutes providing this consultation,  from 1:30pm to 2:30pm. More than 50% of the time in this consultation was spent coordinating communication.   HISTORY OF PRESENT ILLNESS:  Amanda Mercado is a 85 y.o. year old female with multiple medical problems including dementia, COPD, O2 dependence, diabetes, gerd, hypertension, hypothyroidism, psoriasis, arthritis, hard of hearing, bilateral cataract surgery, back surgery, anterior vitrectomy left, abdominal hysterectomy.Ms.Sliter continues to reside in locked memory care unit. She is a lift transferred to the wheelchair where she is able to sit during the day. She does require assistance for adl's and toileting. She is able to feed herself an appetite has been Fair. She does go to activities and interacts with others. No recent infections, wounds, Falls, hospitalizations. DNR remains in place. At present she is sitting in the wheelchair in her room. She appears comfortable, O2 dependent. No visitors present. Palliative Care was asked to help address goals of care.   CODE STATUS: DNR  PPS: 40% HOSPICE ELIGIBILITY/DIAGNOSIS: TBD  PAST MEDICAL HISTORY:  Past Medical History:  Diagnosis Date  . Age-related physical debility   . Arthritis    ra  . COPD (chronic obstructive pulmonary disease) (HCC)   . Diabetes mellitus without complication (HCC)   . Dizziness    occ  . Dyspnea   . GERD (gastroesophageal reflux disease)   . HOH (hard of hearing)   . Hypertension   . Hypothyroidism   . Memory loss   .  Palliative care encounter   . Psoriasis   . Rosacea   . Shortness of breath dyspnea     SOCIAL HX:  Social History   Tobacco Use  . Smoking status: Former Games developer  . Smokeless tobacco: Never Used  Substance Use Topics  . Alcohol use: No    ALLERGIES:  Allergies  Allergen Reactions   . Doxycycline Calcium Other (See Comments)  . Raloxifene Other (See Comments)  . Rofecoxib Other (See Comments)  . Statins Other (See Comments)    Entire body aches, bones and muscles  . Sulfa Antibiotics Other (See Comments)     PERTINENT MEDICATIONS:  Outpatient Encounter Medications as of 02/02/2018  Medication Sig  . acetaminophen (TYLENOL) 500 MG tablet Take 500 mg by mouth every 4 (four) hours as needed for mild pain.   . brimonidine-timolol (COMBIGAN) 0.2-0.5 % ophthalmic solution Place 1 drop into the left eye 2 (two) times daily.   Marland Kitchen docusate sodium (COLACE) 100 MG capsule Take 1 capsule (100 mg total) by mouth 2 (two) times daily as needed for mild constipation.  Marland Kitchen ezetimibe (ZETIA) 10 MG tablet Take 10 mg by mouth daily.  . feeding supplement, ENSURE ENLIVE, (ENSURE ENLIVE) LIQD Take 237 mLs by mouth 2 (two) times daily between meals.  . hydrocortisone (ANUSOL-HC) 25 MG suppository Place 25 mg rectally 2 (two) times daily as needed for hemorrhoids.   Marland Kitchen ipratropium-albuterol (DUONEB) 0.5-2.5 (3) MG/3ML SOLN Take 3 mLs by nebulization every 6 (six) hours as needed.  Marland Kitchen levothyroxine (SYNTHROID, LEVOTHROID) 125 MCG tablet Take 125 mcg by mouth daily before breakfast.  . loratadine (CLARITIN) 10 MG tablet Take 10 mg by mouth daily.  . magnesium oxide (MAG-OX) 400 MG tablet Take 400 mg by mouth daily.  . meclizine (ANTIVERT) 25 MG tablet Take 25 mg by mouth every 8 (eight) hours as needed for dizziness.   . mirtazapine (REMERON) 7.5 MG tablet Take 15 mg by mouth at bedtime.   . polyethylene glycol (MIRALAX) packet Take 17 g by mouth daily.  . sennosides-docusate sodium (SENOKOT-S) 8.6-50 MG tablet Take 2 tablets by mouth daily.   . sertraline (ZOLOFT) 50 MG tablet Take 75 mg by mouth daily.    No facility-administered encounter medications on file as of 02/02/2018.     PHYSICAL EXAM:   General: obese, chronically ill, pleasant female Cardiovascular: regular rate and  rhythm Pulmonary: +wheezes Abdomen: soft, nontender, + bowel sounds GU: no suprapubic tenderness Extremities: no edema, no joint deformities Skin: no rashes Neurological: Weakness but otherwise nonfocal/non-ambulatory-w/c dependent  Christin Prince [place], NP

## 2018-04-07 DEATH — deceased

## 2019-11-29 IMAGING — CR DG HIP (WITH OR WITHOUT PELVIS) 2-3V*L*
3 series · 3 of 3 positions shown · non-contrast
Comparison: 02/24/2017.

CLINICAL DATA: Left hip pain.  No known injury.

EXAM:
DG HIP (WITH OR WITHOUT PELVIS) 2-3V LEFT

[pelvis ap]
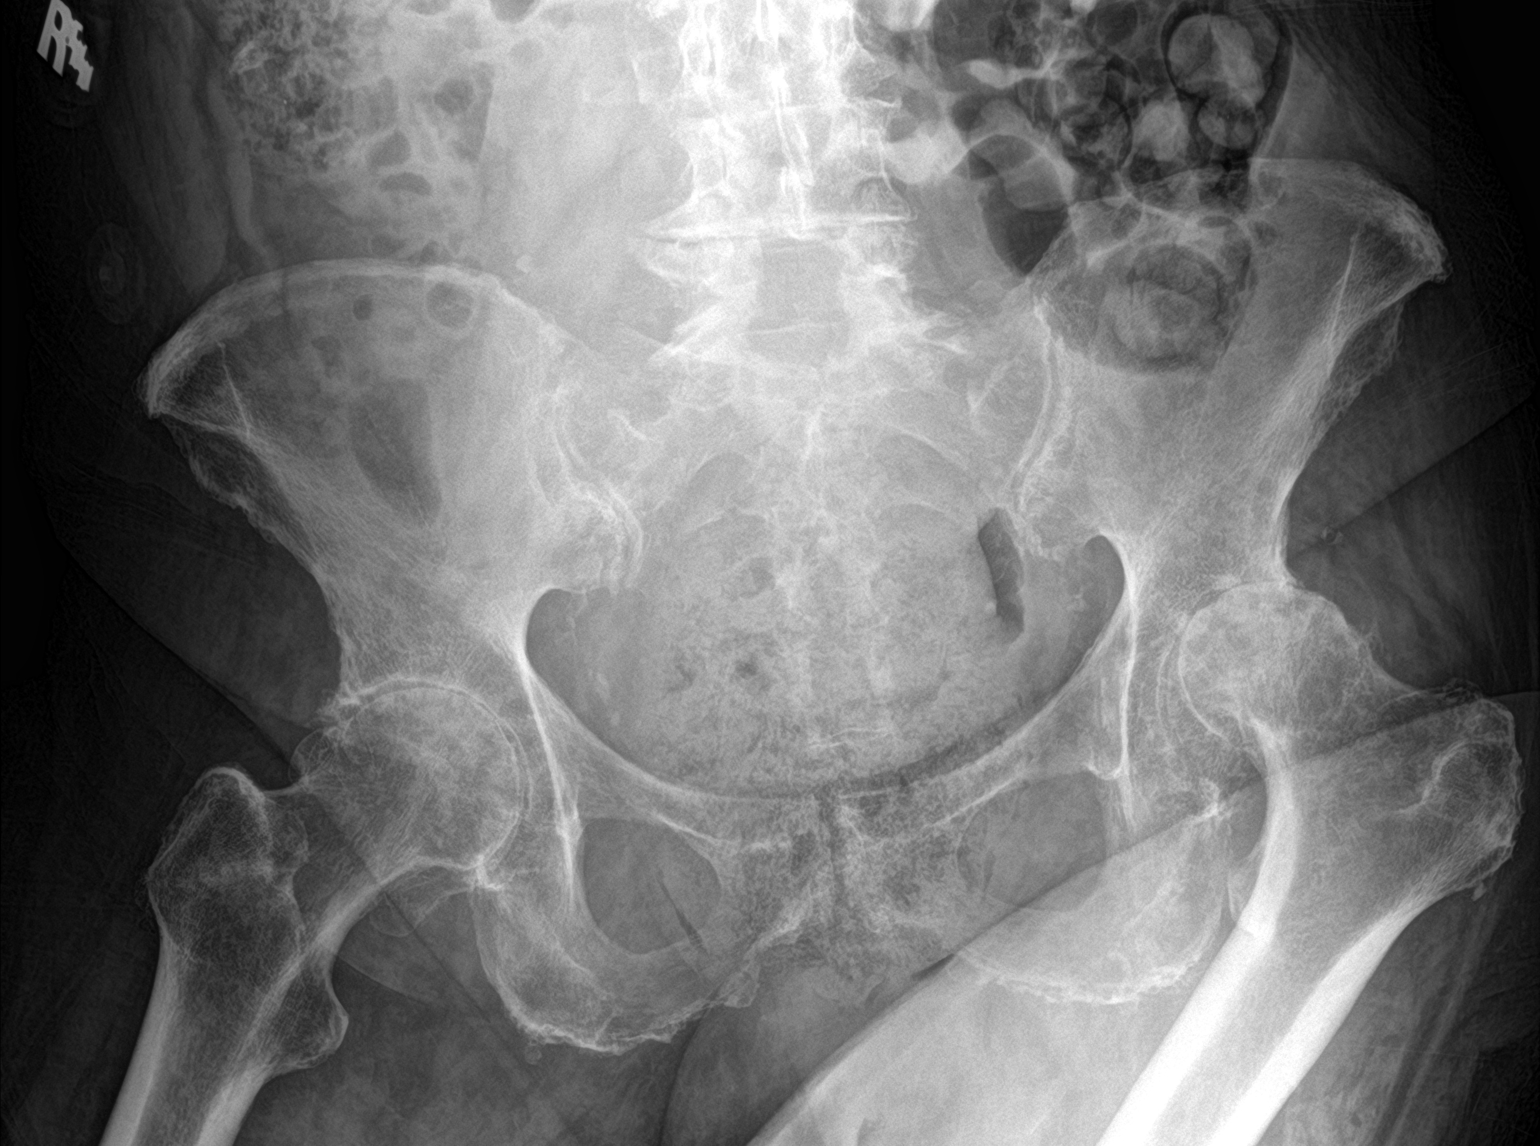

[hip ap]
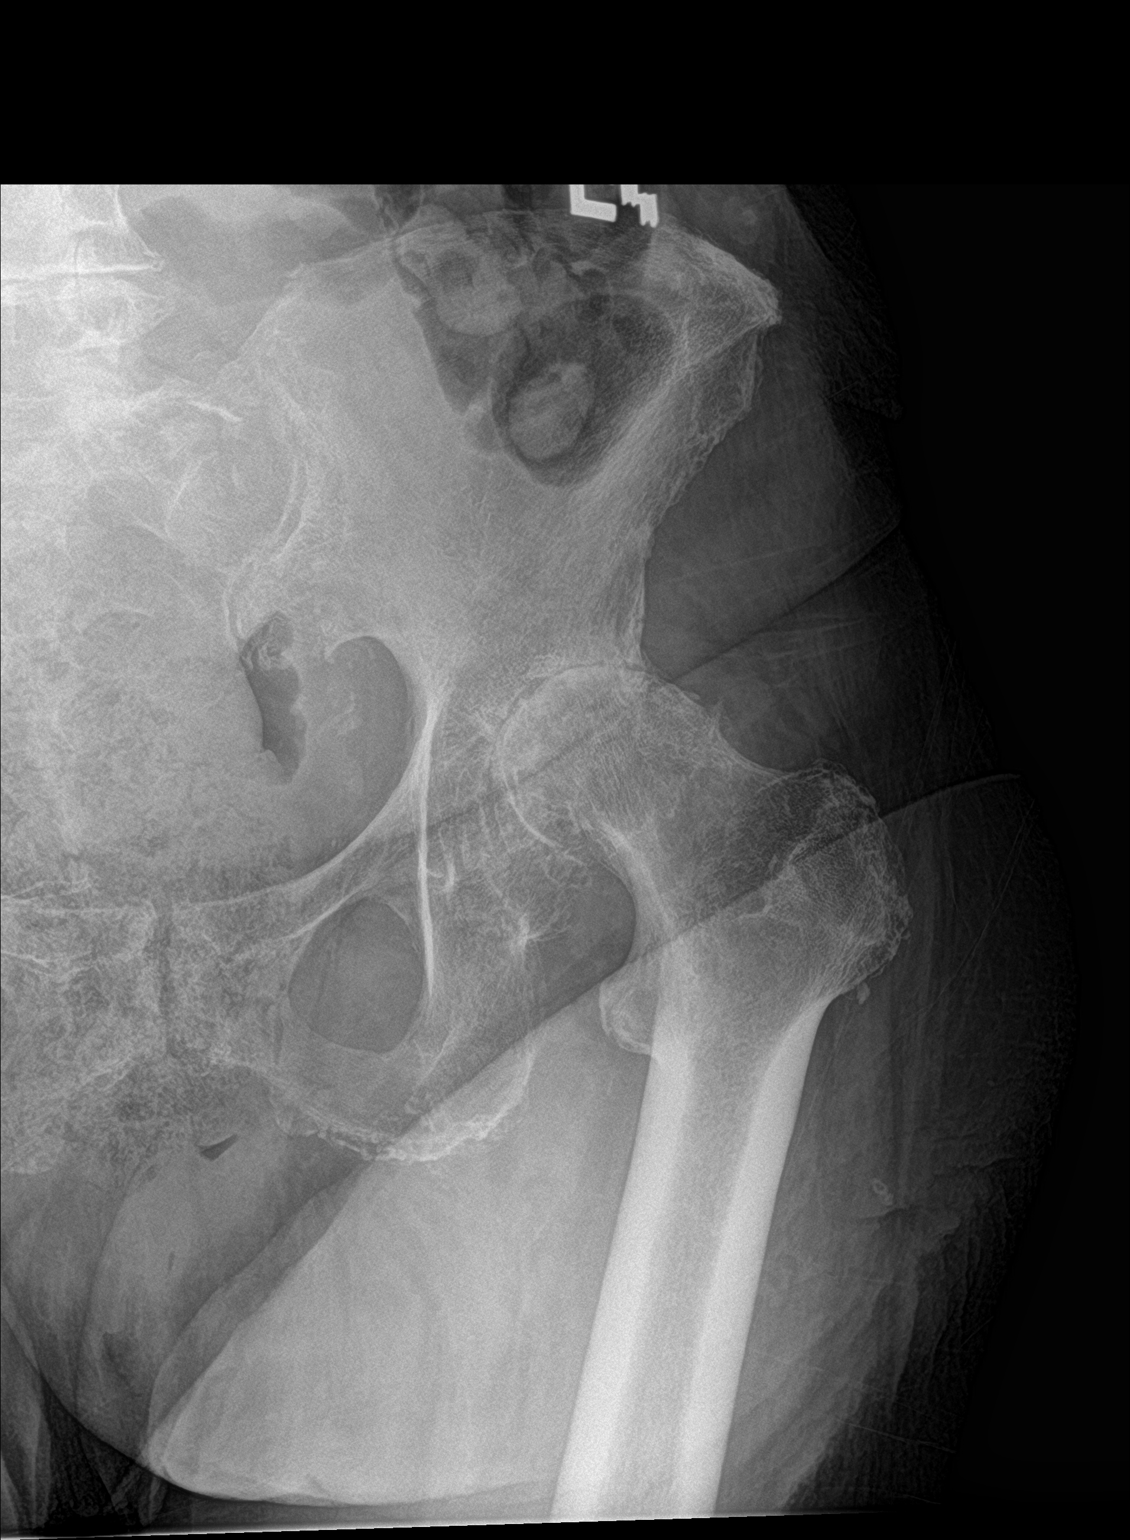

[hip lat]
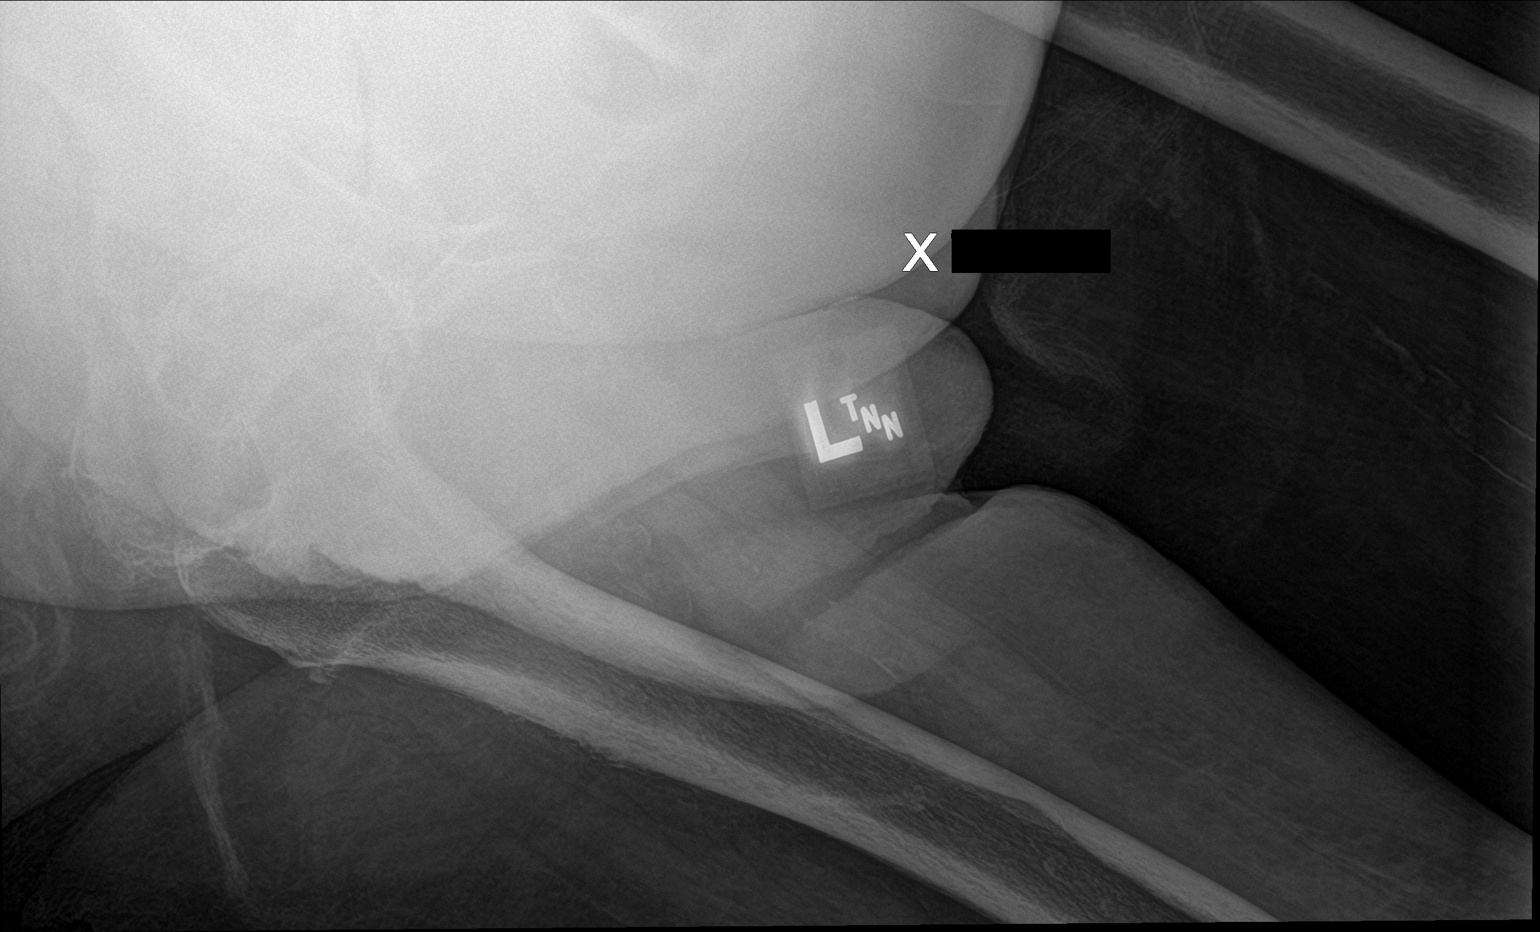

[3 of 3 positions shown; findings below may reference images not displayed]

FINDINGS: Degenerative changes lumbar spine and both hips. Prior lower lumbar
laminectomy. Stable deformity and subluxation of the left hip. No
evidence of dislocation. No evidence of fracture.
IMPRESSION: Severe osteopenia degenerative change. Stable deformity and
subluxation of the left hip. Again a component mild left hip
dysplasia may be present. No evidence of dislocation. No acute bony
abnormality. Exam stable from 02/24/2017.

## 2019-12-10 IMAGING — CT CT CHEST W/O CM
2 of 4 series · 15 of 36 positions shown, 18 images · non-contrast
Comparison: Chest radiograph September 08, 2017 and CT chest [place]

CLINICAL DATA: Difficulty breathing, cough.  History of COPD.

EXAM:
CT CHEST WITHOUT CONTRAST
TECHNIQUE: Multidetector CT imaging of the chest was performed following the
standard protocol without IV contrast.

[Series 2: thorax · axial · 0.63mm/px · z∈[-712,-496]mm · 12 of 126 slices shown, 15 images]
[im 9/126  mediastinal]
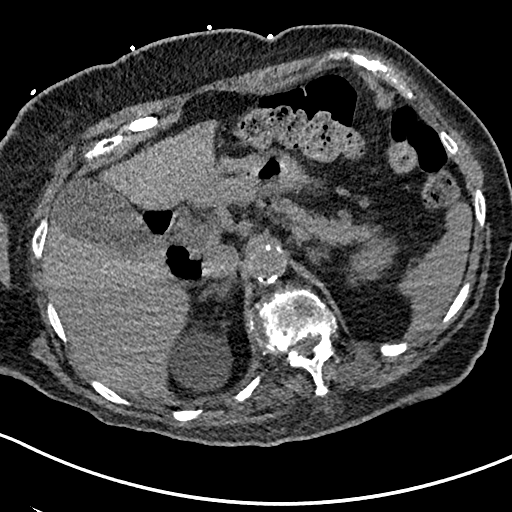
[im 9/126  lung]
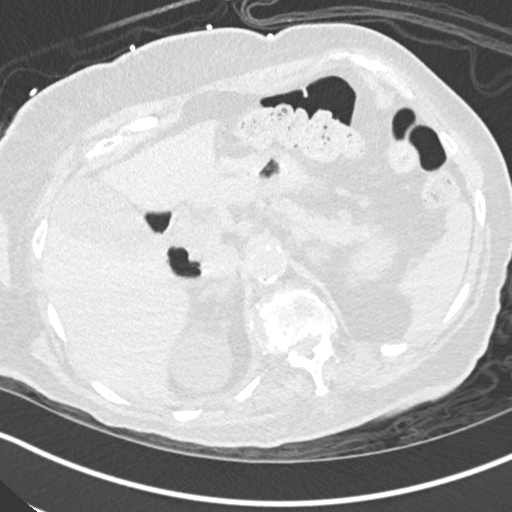
[im 18/126  lung]
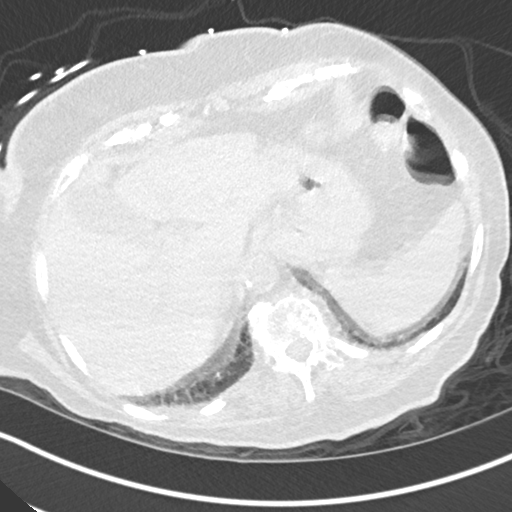
[im 27/126  lung]
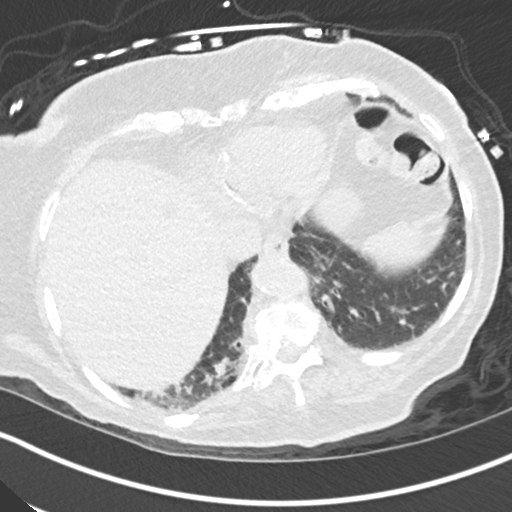
[im 36/126  lung]
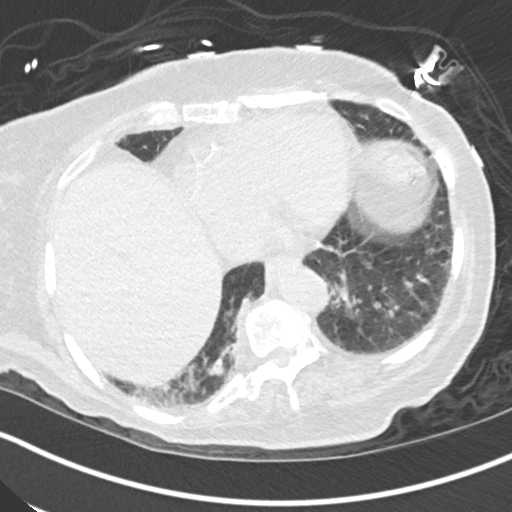
[im 45/126  mediastinal]
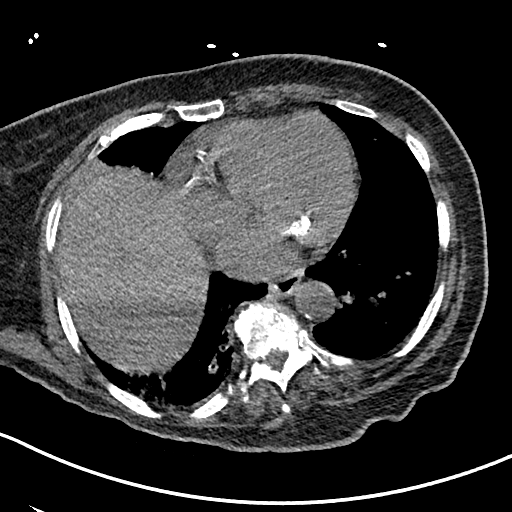
[im 45/126  lung]
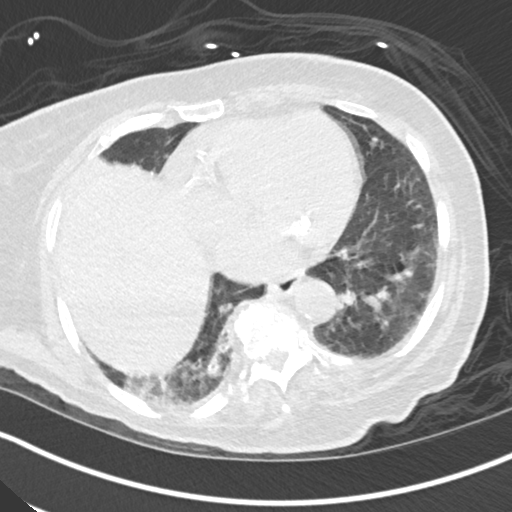
[im 54/126  lung]
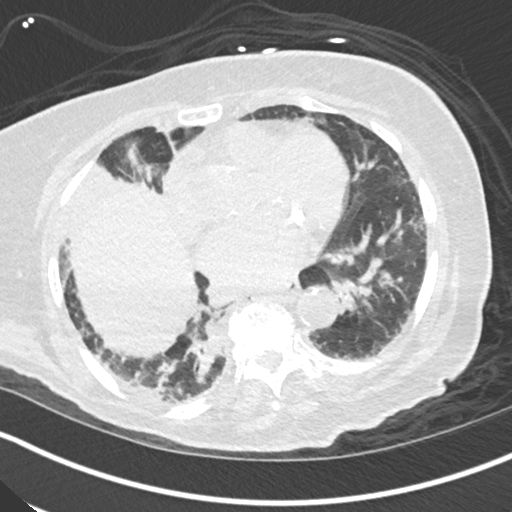
[im 72/126  lung]
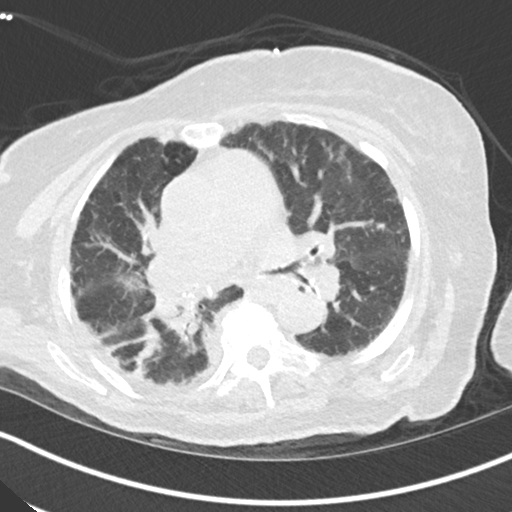
[im 81/126  lung]
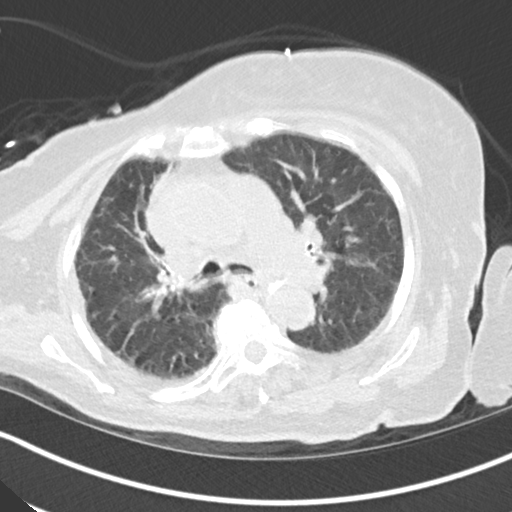
[im 90/126  mediastinal]
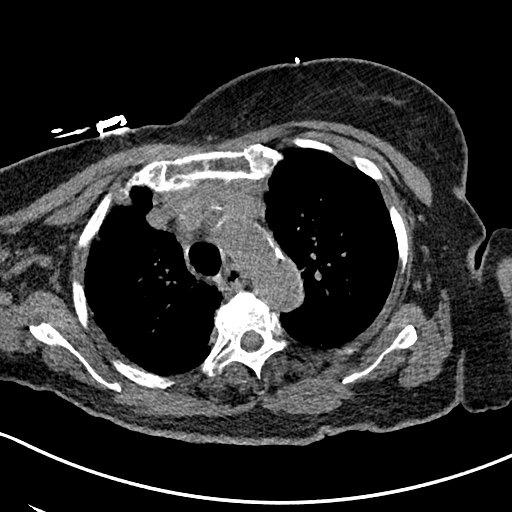
[im 90/126  lung]
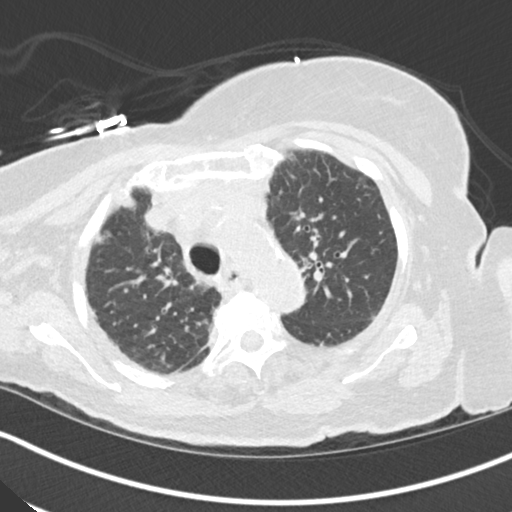
[im 99/126  lung]
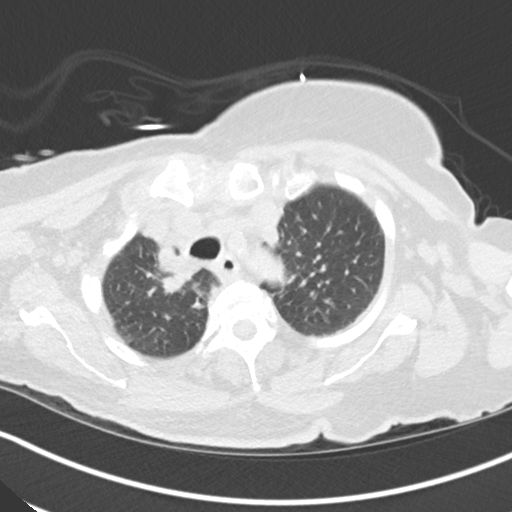
[im 108/126  lung]
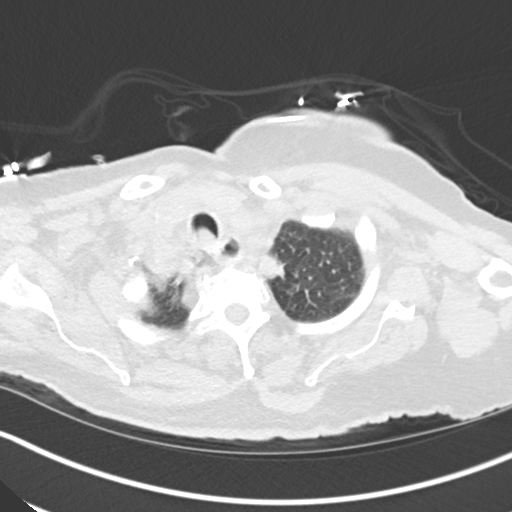
[im 117/126  lung]
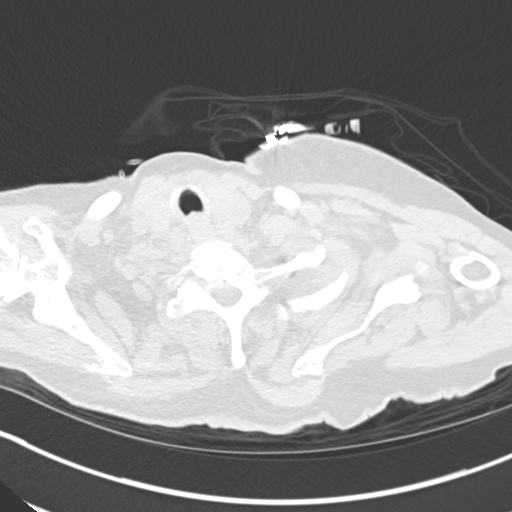

[Series 5: coronal · coronal · 0.56mm/px · 3 of 119 slices shown]
[im 24/119  lung]
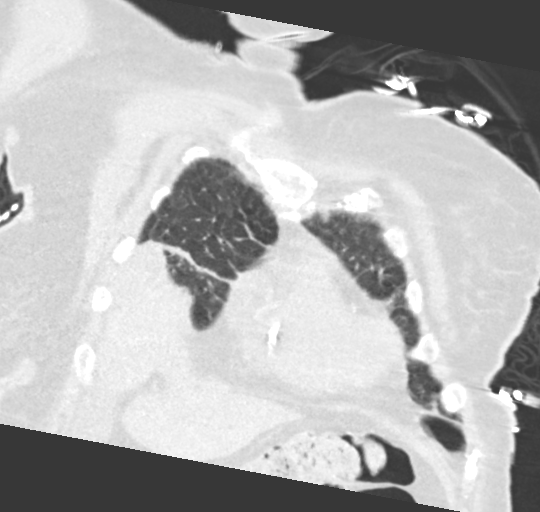
[im 48/119  lung]
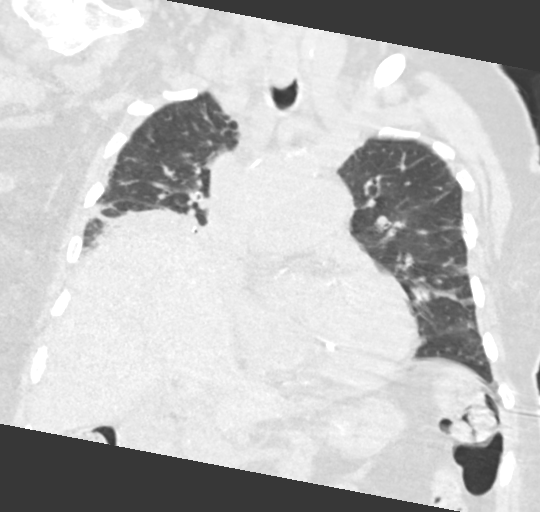
[im 71/119  lung]
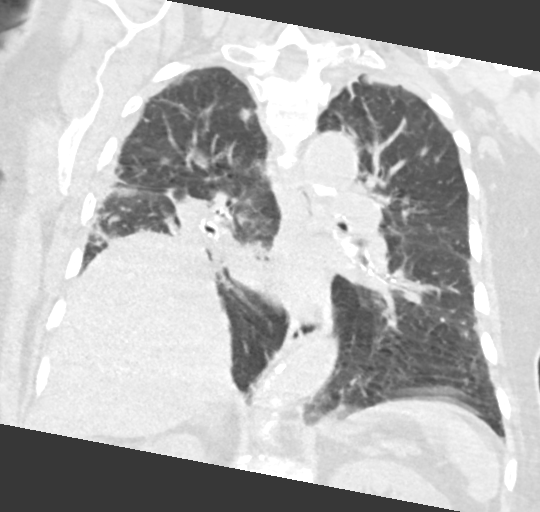

[15 of 36 positions shown; findings below may reference images not displayed]

FINDINGS: CARDIOVASCULAR: The heart is mildly enlarged. Severe coronary artery
calcifications. No pericardial effusion. Thoracic aorta is normal
course and caliber, moderate calcific atherosclerosis. Mildly dense
intima associated with anemia. Main pulmonary artery is enlarged at
3.5 cm seen with chronic pulmonary arterial hypertension. The heart
is mildly enlarged. Moderate coronary artery calcifications.

MEDIASTINUM/NODES: Fullness of the hila concerning for component
lymphadenopathy.

LUNGS/PLEURA: Debris of facing distal RIGHT main stem bronchus with
partially aerated distal bronchi. Narrowed bronchi bilaterally,
debris within LEFT lobar bronchi. Consolidation RIGHT middle lobe,
to lesser extent LEFT lower lobe and lingula. Small RIGHT upper lobe
consolidation. RIGHT pleural thickening without pleural effusion.

UPPER ABDOMEN: Nonacute. Elevated RIGHT hemidiaphragm. 4.6 cm simple
cyst upper pole RIGHT kidney. 2.6 cm cyst LEFT upper pole.

MUSCULOSKELETAL: Nonacute.  Multilevel severe spondylosis.
IMPRESSION: 1. Debris obstructing RIGHT mainstem bronchus, probable aspiration.
Debris obstructing distal LEFT bronchi..
2. Multifocal bibasilar atelectasis and/or pneumonia, most
conspicuous in RIGHT middle lobe.
3. Suspected lymphadenopathy, possibly reactive.
4. Cardiomegaly.

Aortic Atherosclerosis ([id number]).
